# Patient Record
Sex: Female | Born: 1970
Health system: Southern US, Community
[De-identification: ages and names within clinical notes are randomized; demographics above are authoritative.]

## PROBLEM LIST (undated history)

## (undated) DIAGNOSIS — U071 COVID-19: Secondary | ICD-10-CM

## (undated) DIAGNOSIS — E785 Hyperlipidemia, unspecified: Secondary | ICD-10-CM

## (undated) DIAGNOSIS — E119 Type 2 diabetes mellitus without complications: Secondary | ICD-10-CM

## (undated) DIAGNOSIS — I2510 Atherosclerotic heart disease of native coronary artery without angina pectoris: Secondary | ICD-10-CM

## (undated) DIAGNOSIS — F419 Anxiety disorder, unspecified: Secondary | ICD-10-CM

## (undated) DIAGNOSIS — I10 Essential (primary) hypertension: Secondary | ICD-10-CM

## (undated) DIAGNOSIS — F988 Other specified behavioral and emotional disorders with onset usually occurring in childhood and adolescence: Secondary | ICD-10-CM

## (undated) HISTORY — DX: Type 2 diabetes mellitus without complications: E11.9

## (undated) HISTORY — DX: Atherosclerotic heart disease of native coronary artery without angina pectoris: I25.10

## (undated) HISTORY — DX: Essential (primary) hypertension: I10

## (undated) HISTORY — DX: Anxiety disorder, unspecified: F41.9

## (undated) HISTORY — DX: Other specified behavioral and emotional disorders with onset usually occurring in childhood and adolescence: F98.8

## (undated) HISTORY — DX: COVID-19: U07.1

## (undated) HISTORY — DX: Hyperlipidemia, unspecified: E78.5

---

## 2004-08-20 ENCOUNTER — Emergency Department: Payer: Self-pay | Admitting: Emergency Medicine

## 2004-12-20 ENCOUNTER — Emergency Department: Payer: Self-pay | Admitting: Emergency Medicine

## 2004-12-21 ENCOUNTER — Ambulatory Visit: Payer: Self-pay | Admitting: Emergency Medicine

## 2007-07-03 ENCOUNTER — Emergency Department: Payer: Self-pay | Admitting: Emergency Medicine

## 2008-01-24 ENCOUNTER — Emergency Department: Payer: Self-pay | Admitting: Emergency Medicine

## 2008-02-14 ENCOUNTER — Ambulatory Visit: Payer: Self-pay

## 2008-07-04 ENCOUNTER — Other Ambulatory Visit: Payer: Self-pay

## 2008-07-04 ENCOUNTER — Emergency Department: Payer: Self-pay | Admitting: Emergency Medicine

## 2008-12-31 DIAGNOSIS — F900 Attention-deficit hyperactivity disorder, predominantly inattentive type: Secondary | ICD-10-CM | POA: Insufficient documentation

## 2008-12-31 DIAGNOSIS — F419 Anxiety disorder, unspecified: Secondary | ICD-10-CM | POA: Insufficient documentation

## 2010-01-26 ENCOUNTER — Emergency Department: Payer: Self-pay | Admitting: Emergency Medicine

## 2012-03-25 ENCOUNTER — Emergency Department: Payer: Self-pay | Admitting: Emergency Medicine

## 2012-03-25 LAB — CBC WITH DIFFERENTIAL/PLATELET
Basophil %: 0.7 %
Eosinophil %: 0.4 %
HGB: 13.4 g/dL (ref 12.0–16.0)
Lymphocyte #: 1.5 10*3/uL (ref 1.0–3.6)
MCH: 33.3 pg (ref 26.0–34.0)
Monocyte #: 0.4 x10 3/mm (ref 0.2–0.9)
Monocyte %: 5 %
Neutrophil #: 6.6 10*3/uL — ABNORMAL HIGH (ref 1.4–6.5)
Platelet: 279 10*3/uL (ref 150–440)
RDW: 13 % (ref 11.5–14.5)

## 2012-03-25 LAB — COMPREHENSIVE METABOLIC PANEL
Alkaline Phosphatase: 100 U/L (ref 50–136)
Anion Gap: 12 (ref 7–16)
BUN: 18 mg/dL (ref 7–18)
Bilirubin,Total: 0.3 mg/dL (ref 0.2–1.0)
Co2: 20 mmol/L — ABNORMAL LOW (ref 21–32)
Osmolality: 288 (ref 275–301)
SGPT (ALT): 33 U/L
Total Protein: 7.2 g/dL (ref 6.4–8.2)

## 2012-03-25 LAB — URINALYSIS, COMPLETE
Bilirubin,UR: NEGATIVE
Glucose,UR: 150 mg/dL (ref 0–75)
Leukocyte Esterase: NEGATIVE
Ph: 6 (ref 4.5–8.0)
Specific Gravity: 1.021 (ref 1.003–1.030)
Squamous Epithelial: 1
WBC UR: 1 /HPF (ref 0–5)

## 2012-03-25 LAB — PREGNANCY, URINE: Pregnancy Test, Urine: NEGATIVE m[IU]/mL

## 2012-03-25 LAB — LIPASE, BLOOD: Lipase: 114 U/L (ref 73–393)

## 2012-11-19 ENCOUNTER — Inpatient Hospital Stay: Payer: Self-pay | Admitting: Internal Medicine

## 2012-11-19 LAB — BASIC METABOLIC PANEL
Anion Gap: 12 (ref 7–16)
BUN: 18 mg/dL (ref 7–18)
Chloride: 101 mmol/L (ref 98–107)
Co2: 23 mmol/L (ref 21–32)
Creatinine: 0.65 mg/dL (ref 0.60–1.30)
EGFR (African American): >60
EGFR (Non-African Amer.): >60
Potassium: 3.6 mmol/L (ref 3.5–5.1)
Sodium: 136 mmol/L (ref 136–145)

## 2012-11-19 LAB — CBC
HCT: 38.9 % (ref 35.0–47.0)
HGB: 13.6 g/dL (ref 12.0–16.0)
MCH: 33.2 pg (ref 26.0–34.0)
MCHC: 34.8 g/dL (ref 32.0–36.0)
MCV: 95 fL (ref 80–100)
Platelet: 302 10*3/uL (ref 150–440)
RBC: 4.09 10*6/uL (ref 3.80–5.20)
WBC: 8.5 10*3/uL (ref 3.6–11.0)

## 2012-11-19 LAB — CK TOTAL AND CKMB (NOT AT ARMC)
CK, Total: 60 U/L (ref 21–215)
CK, Total: 76 U/L (ref 21–215)
CK, Total: 95 U/L (ref 21–215)
CK-MB: 2.2 ng/mL (ref 0.5–3.6)
CK-MB: 3.1 ng/mL (ref 0.5–3.6)

## 2012-11-19 LAB — DRUG SCREEN, URINE
Benzodiazepine, Ur Scrn: NEGATIVE (ref ?–200)
Cannabinoid 50 Ng, Ur ~~LOC~~: NEGATIVE (ref ?–50)
Cocaine Metabolite,Ur ~~LOC~~: NEGATIVE (ref ?–300)
Tricyclic, Ur Screen: NEGATIVE (ref ?–1000)

## 2012-11-19 LAB — TROPONIN I: Troponin-I: <0.02 ng/mL

## 2012-11-19 LAB — HCG, QUANTITATIVE, PREGNANCY: Beta Hcg, Quant.: <1 m[IU]/mL — ABNORMAL LOW

## 2012-11-20 DIAGNOSIS — I2510 Atherosclerotic heart disease of native coronary artery without angina pectoris: Secondary | ICD-10-CM | POA: Insufficient documentation

## 2012-11-20 LAB — BASIC METABOLIC PANEL
Anion Gap: 19 — ABNORMAL HIGH (ref 7–16)
BUN: 14 mg/dL (ref 7–18)
BUN: 13 mg/dL (ref 7–18)
Calcium, Total: 8.5 mg/dL (ref 8.5–10.1)
Chloride: 100 mmol/L (ref 98–107)
Co2: 14 mmol/L — ABNORMAL LOW (ref 21–32)
Co2: 21 mmol/L (ref 21–32)
Creatinine: 0.35 mg/dL — ABNORMAL LOW (ref 0.60–1.30)
EGFR (Non-African Amer.): >60
Glucose: 311 mg/dL — ABNORMAL HIGH (ref 65–99)
Glucose: 302 mg/dL — ABNORMAL HIGH (ref 65–99)
Osmolality: 271 (ref 275–301)
Osmolality: 276 (ref 275–301)
Potassium: 3.8 mmol/L (ref 3.5–5.1)
Potassium: 5.5 mmol/L — ABNORMAL HIGH (ref 3.5–5.1)
Sodium: 129 mmol/L — ABNORMAL LOW (ref 136–145)
Sodium: 132 mmol/L — ABNORMAL LOW (ref 136–145)

## 2012-11-20 LAB — HEMOGLOBIN A1C: Hemoglobin A1C: 7.6 % — ABNORMAL HIGH (ref 4.2–6.3)

## 2012-11-20 LAB — LIPID PANEL
Cholesterol: 377 mg/dL — ABNORMAL HIGH (ref 0–200)
HDL Cholesterol: 23 mg/dL — ABNORMAL LOW (ref 40–60)

## 2012-11-20 LAB — CBC WITH DIFFERENTIAL/PLATELET
Eosinophil: 1 %
HCT: 35.8 % (ref 35.0–47.0)
HGB: 13.1 g/dL (ref 12.0–16.0)
MCH: 33.2 pg (ref 26.0–34.0)
MCHC: 35 g/dL (ref 32.0–36.0)
MCV: 93 fL (ref 80–100)

## 2012-11-21 LAB — BASIC METABOLIC PANEL
BUN: 15 mg/dL (ref 7–18)
Calcium, Total: 8.6 mg/dL (ref 8.5–10.1)
Creatinine: 0.48 mg/dL — ABNORMAL LOW (ref 0.60–1.30)
EGFR (African American): >60
EGFR (Non-African Amer.): >60
Glucose: 236 mg/dL — ABNORMAL HIGH (ref 65–99)
Potassium: 4.6 mmol/L (ref 3.5–5.1)
Sodium: 135 mmol/L — ABNORMAL LOW (ref 136–145)

## 2012-11-21 LAB — CBC
HCT: 38.5 % (ref 35.0–47.0)
HGB: 15.1 g/dL (ref 12.0–16.0)
MCH: 36.7 pg — ABNORMAL HIGH (ref 26.0–34.0)
MCHC: 35.4 g/dL (ref 32.0–36.0)
MCV: 94 fL (ref 80–100)
RBC: 4.12 10*6/uL (ref 3.80–5.20)

## 2012-11-21 LAB — PROTIME-INR: INR: 0.9

## 2014-06-19 LAB — HM PAP SMEAR: HM PAP: NEGATIVE

## 2014-07-02 ENCOUNTER — Ambulatory Visit: Payer: Self-pay | Admitting: Orthopedic Surgery

## 2015-01-22 NOTE — Discharge Summary (Signed)
PATIENT NAME:  Gabrielle Owens, [place] M MR#:  [id number] DATE OF BIRTH:  Feb 25, 1971  DATE OF ADMISSION:  11/19/2012 DATE OF DISCHARGE:  11/21/2012  ADMITTING DIAGNOSIS:  Chest pain.   DISCHARGE DIAGNOSES: 1.  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  2.  Severe hypertriglyceridemia with known history of hypertriglyceridemia.  The patient was noncompliant with her gemfibrozil.  3.  Diabetes.  4.  Morbid obesity.  5.  Hypertension.   PERTINENT LABORATORY AND EVALUATIONS:  Admitting glucose 237, BUN 18, creatinine 0.65, sodium 136, potassium 3.6, chloride 101, CO2 was 23, calcium was 8.5.  Troponin less than 0.02 initially, then after stress test it was up to 0.24 and then subsequently went up to 0.38.   WBC 8.5, hemoglobin 13.6, platelet count was 302.  Cholesterol 377, triglycerides 979.  Her hemoglobin A1c was 7.6.   CONSULTANTS:  Dr. Darrold JunkerParaschos and Dr. Lady GaryFath.   HOSPITAL COURSE:  Please refer to history and physical done by the admitting physician.  The patient is a 44 year old white female who has a history of hypertension, diabetes, hyperlipidemia, presented to the ED with complaint of chest pressure.  The patient initially in the ED revealed some T wave inversions in lead V3, but no other abnormality was noted.  Her troponin was normal initially.  So she was admitted and a cardiology consult and a stress test was ordered.  The patient underwent a stress test the next day which according to Dr. Lady GaryFath showed no ischemia or infarct.  But she again had some chest pain, therefore she underwent a cardiac cath.  Cardiac cath showed 100% occlusion of the RCA which was a small caliber vessel with good collaterals, so Dr. Darrold JunkerParaschos recommended medical management for that.  The patient at this point is chest  pain-free and is doing well and is stable for discharge.   DISCHARGE MEDICATIONS:  Hydrochlorothiazide 25 by mouth daily, gemfibrozil 600 1 tab by  mouth twice daily, fish oil 1000 mg two caps 2 times per day, metoprolol tartrate 100 1 tab by mouth twice daily, aspirin 325 mg by mouth daily, Cozaar 50 daily, glipizide 5 mg 1 tab by  mouth twice daily.  The patient is to resume metformin 500 1 tab by mouth twice daily after two days.   DIET:  Low sodium, low fat, low cholesterol, carbohydrate-controlled diet.   ACTIVITY:  As tolerated.    TIMEFRAME FOR FOLLOW-UP:  With primary physician in 1 to 2 weeks.  The patient is to keep a log of her blood glucose to take to her primary care provider.  The patient also encouraged to follow a strict diet and exercise.  Also discharge follow-up with Dr. Darrold JunkerParaschos in 7 to 10 days.   TIME SPENT:  35 minutes spent.     ____________________________ Lacie ScottsShreyang H. Allena KatzPatel, MD shp:ea D: 11/21/2012 17:23:39 ET T: 11/22/2012 05:33:35 ET JOB#: [id number]  cc: Tesneem Dufrane H. Allena KatzPatel, MD, <Dictator> Charise CarwinSHREYANG H Bryanne Riquelme MD ELECTRONICALLY SIGNED 11/23/2012 12:13

## 2015-01-22 NOTE — Consult Note (Signed)
   General Aspect 44 yo female with history of diabetes melitus, previous tobacco abuse( Quit two years ago) who was admitted after developing a midsternal tightness with radiation to her left arm with numbness in her left arm. EKG appears to have leads V1 and V3 reversed but was unchanged from baseline. She had a mild trponin elevatino to 0.25. Underwent ett mibi today with good exercize tolerance and no ekg changes or ischmeia on sestamibi images. She is curerntly pain free. She has not had symptoms simlar to this in the past.   Physical Exam:  GEN no acute distress   HEENT PERRL, hearing intact to voice   NECK supple  No masses   RESP normal resp effort  no use of accessory muscles   CARD Regular rate and rhythm  Normal, S1, S2  No murmur   ABD denies tenderness  denies Flank Tenderness   LYMPH negative neck, negative axillae   EXTR negative cyanosis/clubbing, negative edema   SKIN normal to palpation   NEURO cranial nerves intact, motor/sensory function intact   PSYCH A+O to time, place, person   Review of Systems:  Subjective/Chief Complaint chest tightness with left arm numbness   General: No Complaints   Skin: No Complaints   ENT: No Complaints   Neck: No Complaints   Respiratory: No Complaints   Cardiovascular: Tightness   Gastrointestinal: No Complaints   Genitourinary: No Complaints   Vascular: No Complaints   Musculoskeletal: No Complaints   Neurologic: No Complaints   Hematologic: No Complaints   EKG:  EKG NSR    No Known Allergies:    Impression 44 yo female with history of hypertension and diabetes who was admitted after an episode of chest tightness with left arm numbness. She had a mild troopniin elevation to 0.25 with no ischemic ekg changes and negative funcitonal study. Elevation in troponin appears to be demand in etiology.   Plan 1. Ambulate and discharge if stable on asa, metoprolol and lisinopril as well as other home meds. 2,  Follow up with Dr. Darrold JunkerParaschos as outpatient.   Electronic Signatures: Dalia HeadingFath, Kenneth A (MD)  (Signed 18-Feb-14 14:14)  Authored: General Aspect/Present Illness, History and Physical Exam, Review of System, EKG , Allergies, Impression/Plan   Last Updated: 18-Feb-14 14:14 by Dalia HeadingFath, Kenneth A (MD)

## 2015-01-22 NOTE — H&P (Signed)
PATIENT NAME:  Gabrielle Owens, Gabrielle Owens MR#:  [id number] DATE OF BIRTH:  1971-02-20  DATE OF ADMISSION:  11/19/2012  PRIMARY CARE PHYSICIAN: Demetrios Isaacs. Sherrie Mustache, MD  REFERRING PHYSICIAN:  Chiquita Loth, MD  CARDIOLOGIST: Marcina Millard, MD    CHIEF COMPLAINT: Chest pain.   HISTORY OF PRESENT ILLNESS: The patient is a 44 year old Caucasian female with a past medical history of hypertension, diabetes mellitus, hyperlipidemia, is presenting to the ER with a chief complaint of chest pressure. The patient is reporting that she suddenly started having chest pressure in the midsternal area while she was resting at around 11:00 p.m. yesterday. The chest pressure was slowly radiating to the left side of the chest, and to the left shoulder and down the arm, and eventually the left shoulder has become numb. This chest pressure was associated with nausea, diaphoresis and shortness of breath. The patient checked her blood pressure, which was really elevated. It was at 204/138. The patient immediately took aspirin and an extra dose of her home medication, Toprol, and called EMS. By the time patient came into the ER via EMS, her chest pressure was completely resolved, and the patient was chest pain-free at the time of arrival. Initial blood pressure was at 150/101 with a pulse of 79. Her initial troponin was negative at less than 0.02.  A 12-lead EKG has revealed new T wave inversion in lead V3. Lovenox 1 mg/kg subcutaneous x 1 was given in the ER, and hospitalist team is called to admit the patient regarding the unstable angina. The patient was seen by Dr. Darrold Junker, her cardiologist, last year, and she had echocardiogram done at that time. She has never had any stress test or cardiac cath done. The patient is reporting that she takes 3 medications regarding her blood pressure, and recently she lost her insurance and she ran out of losartan 2 months ago, and since then she has been taking only 2 medications regarding her blood  pressure. She denies any headache or blurry vision, no black spots either. She denies any other complaints. In the ER, Lovenox 1 mg/kg subcutaneous x 1 was given.    PAST MEDICAL HISTORY: Hypertension, diabetes mellitus, hyperlipidemia, obesity.   PAST SURGICAL HISTORY: She denies any.   ALLERGIES:  No known drug allergies.   HOME MEDICATIONS: Hydrochlorothiazide25 mg once daily, Xanax 0.5 mg p.o. as needed, Percocet 5/325 4 times a day, metoprolol tartrate 100 mg once a day, metformin 500 mg once daily, lisinopril dose unknown,gemfibrozil 600 mg 2 times a day, fish oil 1000 mg 2 capsules 2 times a day.   PSYCHOSOCIAL HISTORY: She lives at home with her 3 children. She used to smoke but quit smoking 2 years ago.  Occasional intake of alcohol, maybe 1 or 2 times in a month. Denies any illicit drug usage.   FAMILY HISTORY: Father had history of diabetes mellitus, hypertension, and had massive heart attack at age 34. Mom had a history of hypertension.   REVIEW OF SYSTEMS:  CONSTITUTIONAL: Denies any fever, fatigue, weakness. Denies any weight loss or weight gain.  EYES: No blurry vision, glaucoma, cataracts.  EARS, NOSE, THROAT: No tinnitus, ear pain, discharge.  RESPIRATORY: No cough, COPD, wheezing.  CARDIOVASCULAR: Midsternal chest pressure at around 11:00 p.m. on February 17th which was resolved after taking aspirin and beta blocker. Denies any palpitations.  GENITOURINARY: No dysuria, hematuria.  GYNECOLOGICAL AND BREASTS: No breast mass or vaginal discharge.  ENDOCRINE: No polyuria, nocturia, thyroid problems. HEMATOLOGIC/LYMPHATIC: Denies anemia, easy bruising.  MUSCULOSKELETAL:  Denies any neck pain, back pain, shoulder pain or hip pain.  NEUROLOGICAL: No CVA or TIA in the past. No vertigo or ataxia.  PSYCHIATRIC: Denies any insomnia, ADD, OCD.   PHYSICAL EXAMINATION: VITAL SIGNS: Temperature 98.4, pulse 93, respirations 18, blood pressure 150/83, pulse of 98%.  GENERAL APPEARANCE:  Not in acute distress, moderately  built and obese.  HEENT: Normocephalic, atraumatic. Pupils are equally reacting to light and accommodation. No scleral icterus. No sinus tenderness. No postnasal drip.  NECK: Supple. No JVD. No thyromegaly.  LUNGS: Clear to auscultation bilaterally. No accessory muscle usage, no anterior chest wall tenderness on palpation.   CARDIAC: S1, S2 normal. Regular rate and rhythm. No murmurs.  GASTROINTESTINAL: Soft, obese. Bowel sounds are positive in all 4 quadrants. Nontender, nondistended. No masses felt.  NEUROLOGICAL: Awake, alert, oriented x3. Cranial nerves II through XII are grossly intact. Motor and sensory are grossly intact. Reflexes are 2+.  EXTREMITIES: No edema. No cyanosis. No clubbing.  SKIN: No lesions. No rashes. Normal turgor, warm to touch.  PSYCHIATRIC: Normal mood and affect.   LABORATORY AND RADIOLOGICAL DATA:  Glucose 237, BUN 18, creatinine 0.6, sodium 136, potassium 3.6, chloride 101, CO2 23, anion gap is 12, serum osmolality 282, calcium 8.5. GFR greater than 60. CK total 60. Troponin less than 002. CPK-MB less than 0.5. Urine drug screen is negative. WBC 8.5, hemoglobin 13.6, hematocrit 38.9, platelet count 302.   A 12-lead EKG has revealed normal sinus rhythm at 81 beats per minute, normal PR interval, normal QRS. A new T wave inversion is noticed in only lead V3. No ST elevations or depressions are noticed.   ASSESSMENT AND PLAN: The patient is a 44 year old Caucasian female presenting to the ER with chief complaint of midsternal chest pressure radiating to the left shoulder, associated with nausea, shortness of breath and diaphoresis which was  subsequently resolved after taking aspirin and beta blocker.  She will be admitted with the following assessment and plan.   1. Unstable angina: We will keep her n.p.o. We will provide her IV fluids with half normal saline with 20 KCl. Lovenox 1 mg/kg subcutaneous q. 12 hours will be given. We will  schedule a Lexiscan stress test in the a.m. ACS protocol. Consult cardiology, Dr. Darrold JunkerParaschos, as the patient was seen by Dr. Darrold JunkerParaschos in the past. I will hold the blood pressure medications in the morning as the patient is scheduled for stress test in the a.m.  2. Hypertensive urgency: We will resume her home medications and up-titrate on as-needed basis. Consult case management regarding discharge planning as she does not have insurance.  3. Diabetes mellitus: The patient being n.p.o., we will provide her insulin sliding scale and hold metformin for now.  4. Hyperlipidemia: Plan is to check her fasting lipid panel and will provide her statin.  5. Obesity: The patient should start working on her diet and exercise. We will put in a consult to a dietitian.  6. Gastrointestinal and deep vein thrombosis prophylaxis will be provided.   CODE STATUS: She is FULL CODE.   The diagnosis and plan of care was discussed with the patient.  She is aware of the plan.  TOTAL TIME SPENT ON ADMISSION: 50 minutes.   ____________________________ Gabrielle LabAruna Jeral Zick, MD ag:cb D: 11/19/2012 04:35:39 ET T: 11/19/2012 09:55:13 ET JOB#: [id number]  cc: Gabrielle LabAruna Charity Tessier, MD, <Dictator> Marcina MillardAlexander Paraschos, MD Demetrios Isaacsonald E. Sherrie MustacheFisher, MD  Gabrielle LabARUNA Saadiq Poche MD ELECTRONICALLY SIGNED 11/20/2012 5:37

## 2015-03-10 ENCOUNTER — Ambulatory Visit: Payer: Medicaid Other | Admitting: Family Medicine

## 2015-03-10 DIAGNOSIS — M5440 Lumbago with sciatica, unspecified side: Secondary | ICD-10-CM | POA: Insufficient documentation

## 2015-03-10 DIAGNOSIS — R87810 Cervical high risk human papillomavirus (HPV) DNA test positive: Secondary | ICD-10-CM | POA: Insufficient documentation

## 2015-03-10 DIAGNOSIS — M25569 Pain in unspecified knee: Secondary | ICD-10-CM | POA: Insufficient documentation

## 2015-03-10 DIAGNOSIS — R6889 Other general symptoms and signs: Secondary | ICD-10-CM | POA: Insufficient documentation

## 2015-03-10 DIAGNOSIS — R319 Hematuria, unspecified: Secondary | ICD-10-CM | POA: Insufficient documentation

## 2015-03-10 HISTORY — DX: Cervical high risk human papillomavirus (HPV) DNA test positive: R87.810

## 2015-03-15 ENCOUNTER — Other Ambulatory Visit: Payer: Self-pay | Admitting: Family Medicine

## 2015-03-15 DIAGNOSIS — F419 Anxiety disorder, unspecified: Secondary | ICD-10-CM

## 2015-03-15 NOTE — Telephone Encounter (Signed)
Please call in  [id number] [phone]   Surescripts Out Interface  6 hours ago   (1:47 PM)       Requested Medications     Medication name:  Name from pharmacy:  ALPRAZolam (XANAX) 0.5 MG tablet ALPRAZOLAM 0.5 MG TABLET    Sig: TAKE 1 TABLET BY MOUTH EVERY 8 HOURS AS NEEDED    #30, RF x3

## 2015-03-16 NOTE — Telephone Encounter (Signed)
Rx phoned into pharmacy.

## 2015-04-28 ENCOUNTER — Encounter: Payer: Self-pay | Admitting: Family Medicine

## 2015-04-28 ENCOUNTER — Ambulatory Visit (INDEPENDENT_AMBULATORY_CARE_PROVIDER_SITE_OTHER): Payer: Medicaid Other | Admitting: Family Medicine

## 2015-04-28 VITALS — BP 130/74 | HR 76 | Temp 98.4°F | Resp 16 | Wt 198.6 lb

## 2015-04-28 DIAGNOSIS — S39012A Strain of muscle, fascia and tendon of lower back, initial encounter: Secondary | ICD-10-CM | POA: Diagnosis not present

## 2015-04-28 DIAGNOSIS — E781 Pure hyperglyceridemia: Secondary | ICD-10-CM | POA: Insufficient documentation

## 2015-04-28 DIAGNOSIS — E119 Type 2 diabetes mellitus without complications: Secondary | ICD-10-CM | POA: Insufficient documentation

## 2015-04-28 DIAGNOSIS — IMO0002 Reserved for concepts with insufficient information to code with codable children: Secondary | ICD-10-CM | POA: Insufficient documentation

## 2015-04-28 DIAGNOSIS — G8929 Other chronic pain: Secondary | ICD-10-CM | POA: Insufficient documentation

## 2015-04-28 MED ORDER — HYDROCODONE-ACETAMINOPHEN 5-325 MG PO TABS
ORAL_TABLET | ORAL | Status: DC
Start: 2015-04-28 — End: 2015-05-11

## 2015-04-28 NOTE — Progress Notes (Signed)
Subjective:     Patient ID: Gabrielle Owens, female   DOB: [birth year], 44 y.o.   MRN: [id number]  HPI  Chief Complaint  Patient presents with  . Back Pain    X 4 days  Denies radiation of pain or spasm. Hx of L4-L5 central disk herniation without canal impingement per MRI 2009. States she was helping her fiance move and helped lift an 80# amplifier. Has tried ibuprofen 800 mg.and meloxicam for her sx. 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   Review of Systems  Constitutional: Negative for fever and chills.  Genitourinary: Negative for dysuria.  Musculoskeletal:       Pain increases with sitting and improves with lying down.       Objective:   Physical Exam  Constitutional: She appears well-developed and well-nourished. She appears distressed (mild pain ).  Musculoskeletal:  Localizes to lower lumbar vertebral area. Minimally tender to the touch. Muscle strength in lower extremities 5/5.  Right SLR to 90 degrees without radiation of back pain. Left SLR to 45 degrees before increased back pain.       Assessment:    1. Low back strain, initial encounter - HYDROcodone-acetaminophen (NORCO/VICODIN) 5-325 MG per tablet; One every 4-6 hours as needed for pain  Dispense: 28 tablet; Refill: 0    Plan:    Continue ibuprofen 800 mg. 3 x day with food. Will take today off from work.

## 2015-04-28 NOTE — Patient Instructions (Addendum)
Continue ibuprofen 800 mg. 3 x day with food. Call if you are not improving over two weeks or pain is traveling down your leg.

## 2015-05-03 ENCOUNTER — Encounter: Payer: Self-pay | Admitting: Family Medicine

## 2015-05-11 ENCOUNTER — Encounter: Payer: Self-pay | Admitting: Family Medicine

## 2015-05-11 ENCOUNTER — Ambulatory Visit (INDEPENDENT_AMBULATORY_CARE_PROVIDER_SITE_OTHER): Payer: Medicaid Other | Admitting: Family Medicine

## 2015-05-11 VITALS — BP 124/82 | HR 62 | Temp 98.2°F | Resp 16 | Ht 62.0 in | Wt 200.0 lb

## 2015-05-11 DIAGNOSIS — S39012A Strain of muscle, fascia and tendon of lower back, initial encounter: Secondary | ICD-10-CM | POA: Insufficient documentation

## 2015-05-11 DIAGNOSIS — S39012D Strain of muscle, fascia and tendon of lower back, subsequent encounter: Secondary | ICD-10-CM | POA: Diagnosis not present

## 2015-05-11 MED ORDER — TRAMADOL HCL 50 MG PO TABS: 50.0000 mg | ORAL_TABLET | Freq: Four times a day (QID) | ORAL | Status: DC | PRN

## 2015-05-11 NOTE — Patient Instructions (Signed)
Discussed use of ibuprofen 400 mg. (two over the counter pills) 3 x day with food.

## 2015-05-11 NOTE — Progress Notes (Signed)
Subjective:     Patient ID: Gabrielle Owens, female   DOB: 1970/11/11, 44 y.o.   MRN: [id number]  HPI  Chief Complaint  Patient presents with  . Follow-up    Patient is present in office today for follow up from 7/27. Patient was seen in office with complaints of back pain and was prescribed Hydrocodone-Acetaminophen and Ibuprofen. Patient states she d/c Hydrocodone because it made her feel "loopy" and had taken Ultram for pain she states today that pain is still same.   States she has not been taking ibuprofen. Reports improvement but has taken 2-3 Tramadol daily to get through her work day. No radiation of pain or re-injury reported.   Review of Systems     Objective:   Physical Exam  Constitutional: She appears well-developed and well-nourished. No distress.  Musculoskeletal:  Localizes to lumbar vertebral area. SLR's improved to 75 degrees bilaterally.       Assessment:    1. Low back strain, subsequent encounter: improving - traMADol (ULTRAM) 50 MG tablet; Take 1 tablet (50 mg total) by mouth every 6 (six) hours as needed.  Dispense: 40 tablet; Refill: 0    Plan:    Add ibuprofen 400 mg. 3 x day with food. Call if no change in two weeks for consideration of LS spine x-ray.

## 2015-05-16 ENCOUNTER — Other Ambulatory Visit: Payer: Self-pay | Admitting: Family Medicine

## 2015-05-16 DIAGNOSIS — F419 Anxiety disorder, unspecified: Secondary | ICD-10-CM

## 2015-05-17 NOTE — Telephone Encounter (Signed)
Last ov for anxiety 06/19/14

## 2015-05-19 NOTE — Telephone Encounter (Signed)
Please call in the following medication.  [id number] [phone]   Surescripts Out Interface  3 days ago   (9:53 PM)       Requested Medications     Name from pharmacy:  In chart as:  ALPRAZOLAM 0.5 MG TABLET ALPRAZolam (XANAX) 0.5 MG tablet    Sig: TAKE 1 TABLET BY MOUTH EVERY 8 HOURS AS NEEDED    Dispense: 30 tablet   Refills: 1

## 2015-05-25 ENCOUNTER — Other Ambulatory Visit: Payer: Self-pay | Admitting: Family Medicine

## 2015-05-25 MED ORDER — METFORMIN HCL ER 500 MG PO TB24: ORAL_TABLET | ORAL | Status: DC

## 2015-05-25 NOTE — Telephone Encounter (Signed)
Pt scheduled an appt for next Wednesday for a f/u but she stated that she needs metFORMIN (GLUCOPHAGE) 1000 MG tablet to [facility]. Pt stated she will be out after tomorrow and next Wednesday is as soon as she can come in. Thanks TNP

## 2015-05-25 NOTE — Telephone Encounter (Signed)
Please advise refill? 

## 2015-05-31 ENCOUNTER — Other Ambulatory Visit: Payer: Self-pay | Admitting: Family Medicine

## 2015-05-31 DIAGNOSIS — S39012D Strain of muscle, fascia and tendon of lower back, subsequent encounter: Secondary | ICD-10-CM

## 2015-05-31 MED ORDER — TRAMADOL HCL 50 MG PO TABS: 50.0000 mg | ORAL_TABLET | Freq: Four times a day (QID) | ORAL | Status: DC | PRN

## 2015-05-31 NOTE — Telephone Encounter (Signed)
Refill request

## 2015-05-31 NOTE — Telephone Encounter (Signed)
Tramadol rx up front for pickup 

## 2015-05-31 NOTE — Telephone Encounter (Signed)
traMADol (ULTRAM) 50 MG tablet 05/11/15 -- Anola Gurney, PA Take 1 tablet (50 mg total) by mouth every 6 (six) hours as needed  [facility] [address]  Thanks [employer]

## 2015-05-31 NOTE — Telephone Encounter (Signed)
Need to see her in the office if she still is having back pain requiring Tramadol.

## 2015-05-31 NOTE — Telephone Encounter (Signed)
Patient states that she has an appt scheduled with you tomorrow to follow up in reference to back pain, she states that she is out of Rx and has to work tonight that is why she is requesting refill for today. Please advise. KW

## 2015-05-31 NOTE — Telephone Encounter (Signed)
Patient has been advised for pick up. KW

## 2015-06-01 ENCOUNTER — Ambulatory Visit: Payer: Self-pay | Admitting: Family Medicine

## 2015-06-02 ENCOUNTER — Ambulatory Visit: Payer: Self-pay | Admitting: Family Medicine

## 2015-06-22 ENCOUNTER — Ambulatory Visit (INDEPENDENT_AMBULATORY_CARE_PROVIDER_SITE_OTHER): Payer: Medicaid Other | Admitting: Family Medicine

## 2015-06-22 ENCOUNTER — Encounter: Payer: Self-pay | Admitting: Family Medicine

## 2015-06-22 DIAGNOSIS — S39012D Strain of muscle, fascia and tendon of lower back, subsequent encounter: Secondary | ICD-10-CM | POA: Diagnosis not present

## 2015-06-22 MED ORDER — TRAMADOL HCL 50 MG PO TABS: 50.0000 mg | ORAL_TABLET | Freq: Four times a day (QID) | ORAL | Status: DC | PRN

## 2015-06-22 NOTE — Patient Instructions (Signed)
We will call with the x-ray report.

## 2015-06-22 NOTE — Progress Notes (Signed)
Subjective:     Patient ID: Gabrielle Owens, female   DOB: 1970/12/03, 44 y.o.   MRN: [id number]  HPI  Chief Complaint  Patient presents with  . Follow-up    Patient is present in office today for follow up from 05/11/15. Patient was diagnosed with lower back strain which was improving on Tramadol , patient was advised to add Ibuprofen  TID. Patient reports that pain is intermittent.   States pain level varies from day to day and she does not always need pain medication. Reports a burning pain when she sits and more of a sharp spasm pain when she may be changing postions bending her over for a few seconds.   Review of Systems  Musculoskeletal:       No radiation of pain       Objective:   Physical Exam  Constitutional: She appears well-developed and well-nourished. No distress.  Musculoskeletal:  Localizes to midline lower lumbar vertebral/paravertebral area. Non-tender today.       Assessment:    1. Low back strain, subsequent encounter - traMADol (ULTRAM) 50 MG tablet; Take 1 tablet (50 mg total) by mouth every 6 (six) hours as needed.  Dispense: 40 tablet; Refill: 1 - DG Lumbar Spine Complete; Future    Plan:    Further f/u pending x-ray report.

## 2015-06-28 ENCOUNTER — Ambulatory Visit
Admission: RE | Admit: 2015-06-28 | Discharge: 2015-06-28 | Disposition: A | Discharge status: Home / Self Care | Payer: Medicaid Other | Source: Ambulatory Visit | Attending: Family Medicine | Admitting: Family Medicine

## 2015-06-28 DIAGNOSIS — S39012D Strain of muscle, fascia and tendon of lower back, subsequent encounter: Secondary | ICD-10-CM

## 2015-06-28 DIAGNOSIS — K8020 Calculus of gallbladder without cholecystitis without obstruction: Secondary | ICD-10-CM | POA: Diagnosis not present

## 2015-06-28 DIAGNOSIS — M47816 Spondylosis without myelopathy or radiculopathy, lumbar region: Secondary | ICD-10-CM | POA: Diagnosis not present

## 2015-06-28 DIAGNOSIS — X58XXXD Exposure to other specified factors, subsequent encounter: Secondary | ICD-10-CM | POA: Diagnosis not present

## 2015-06-29 ENCOUNTER — Telehealth: Payer: Self-pay

## 2015-06-29 ENCOUNTER — Other Ambulatory Visit: Payer: Self-pay | Admitting: Family Medicine

## 2015-06-29 DIAGNOSIS — M545 Low back pain, unspecified: Secondary | ICD-10-CM

## 2015-06-29 NOTE — Telephone Encounter (Signed)
Pt returned call. Thanks TNP °

## 2015-06-29 NOTE — Telephone Encounter (Signed)
Advised patient of report she states she would like to proceed with orthopedic evaluation and request that we do not send her to [facility] she would like to go to a practice in [place]. KW

## 2015-06-29 NOTE — Telephone Encounter (Signed)
-----   Message from Anola Gurney, [place] sent at 06/28/2015  4:24 PM EDT ----- Arthritic changes at L4-L5 where she has had previous disc bulge on MRI.Marland Kitchen Also incidental finding of gallstone (this would not give her low back pain) Would recommend orthopedic evaluation. Does she wish to proceed?

## 2015-06-29 NOTE — Telephone Encounter (Signed)
[id number] 

## 2015-06-29 NOTE — Telephone Encounter (Signed)
Referral in progress. 

## 2015-06-29 NOTE — Telephone Encounter (Signed)
Pt is returning call.  ZO#[phone]/[phone]

## 2015-07-12 ENCOUNTER — Other Ambulatory Visit: Payer: Self-pay | Admitting: Family Medicine

## 2015-07-14 ENCOUNTER — Telehealth: Payer: Self-pay | Admitting: Family Medicine

## 2015-07-20 ENCOUNTER — Ambulatory Visit: Payer: Self-pay | Admitting: Family Medicine

## 2015-09-02 ENCOUNTER — Ambulatory Visit (INDEPENDENT_AMBULATORY_CARE_PROVIDER_SITE_OTHER): Payer: 59 | Admitting: Family Medicine

## 2015-09-02 ENCOUNTER — Encounter: Payer: Self-pay | Admitting: Family Medicine

## 2015-09-02 VITALS — BP 140/100 | HR 76 | Temp 97.7°F | Resp 16 | Ht 62.0 in | Wt 200.0 lb

## 2015-09-02 DIAGNOSIS — E119 Type 2 diabetes mellitus without complications: Secondary | ICD-10-CM

## 2015-09-02 DIAGNOSIS — F419 Anxiety disorder, unspecified: Secondary | ICD-10-CM

## 2015-09-02 DIAGNOSIS — S39012D Strain of muscle, fascia and tendon of lower back, subsequent encounter: Secondary | ICD-10-CM | POA: Diagnosis not present

## 2015-09-02 DIAGNOSIS — I10 Essential (primary) hypertension: Secondary | ICD-10-CM

## 2015-09-02 DIAGNOSIS — G8929 Other chronic pain: Secondary | ICD-10-CM

## 2015-09-02 DIAGNOSIS — E781 Pure hyperglyceridemia: Secondary | ICD-10-CM

## 2015-09-02 LAB — POCT GLYCOSYLATED HEMOGLOBIN (HGB A1C)
Est. average glucose Bld gHb Est-mCnc: 189
Hemoglobin A1C: 8.2

## 2015-09-02 MED ORDER — METFORMIN HCL ER 500 MG PO TB24: 1000.0000 mg | ORAL_TABLET | Freq: Two times a day (BID) | ORAL | Status: DC

## 2015-09-02 MED ORDER — TRAMADOL HCL 50 MG PO TABS: 50.0000 mg | ORAL_TABLET | Freq: Four times a day (QID) | ORAL | Status: DC | PRN

## 2015-09-02 MED ORDER — ALPRAZOLAM 0.5 MG PO TABS: 0.5000 mg | ORAL_TABLET | Freq: Three times a day (TID) | ORAL | Status: DC | PRN

## 2015-09-02 NOTE — Progress Notes (Signed)
Patient: Gabrielle Owens Female    DOB: 20-Nov-1970   44 y.o.   MRN: [id number] Visit Date: 09/02/2015  Today's Provider: Mila Merryonald Sargun Rummell, MD   Chief Complaint  Patient presents with  . Diabetes    follow up  . Anxiety    follow up   Subjective:    HPI  Diabetes Mellitus Type II, Follow-up:   Lab Results  Component Value Date   HGBA1C 7.6* 11/20/2012    Last seen for diabetes 1 years ago.  Her A1c done through work in August 2015 was 10.7% Management since then includes increasing Metformin to 1,000mg  twice daily.  She has been back on 500mg  twice a day since October since pharmacy requested 500mg  tablets instead of the 1000s. She did tolerate the 1000 just fine, but didn't notice much change in her blood sugars.  She reports good compliance with treatment. She is not having side effects.  Current symptoms include none and have been stable. Home blood sugar records: fasting range: 150-200  Episodes of hypoglycemia? no   Current Insulin Regimen: none Most Recent Eye Exam: 1 year ago Weight trend: stable Prior visit with dietician: no Current diet: in general, a "healthy" diet   Current exercise: walking at work uses her fit bit  Pertinent Labs:    Component Value Date/Time   CHOL 377* 11/20/2012 0800   TRIG 979* 11/20/2012 0800   HDL 23* 11/20/2012 0800   LDLCALC SEE COMMENT 11/20/2012 0800   CREATININE 0.48* 11/21/2012 0458    Wt Readings from Last 3 Encounters:  06/22/15 198 lb 9.6 oz (90.084 kg)  05/11/15 200 lb (90.719 kg)  04/28/15 198 lb 9.6 oz (90.084 kg)    ------------------------------------------------------------------------  Hypertension, follow-up:  BP Readings from Last 3 Encounters:  09/02/15 140/100  06/22/15 112/84  05/11/15 124/82    Hypertension is followed by Dr. Jeri LagerParashcos and states she has follow up in January. She states she occasionally misses medication due to work schedule, and did miss her last dose of  metoprolol  She is not having side effects.  She is exercising. She is adherent to low salt diet.   Outside blood pressures are not being checked. She is experiencing none.  Patient denies chest pain, chest pressure/discomfort, claudication, dyspnea, exertional chest pressure/discomfort, fatigue, irregular heart beat, lower extremity edema, near-syncope, orthopnea, palpitations and paroxysmal nocturnal dyspnea.       Weight trend: stable Wt Readings from Last 3 Encounters:  09/02/15 200 lb (90.719 kg)  06/22/15 198 lb 9.6 oz (90.084 kg)  05/11/15 200 lb (90.719 kg)    Current diet: in general, a "healthy" diet    ------------------------------------------------------------------------    Follow up Anxiety: Last office visit was 1 year ago and no changes were made. Current treatment includes Xanax as needed. Patient has been out of this medication for 1 month and request a refill. She only takes periodically, but finds that it works well.     No Known Allergies Previous Medications   ALPRAZOLAM (XANAX) 0.5 MG TABLET    TAKE 1 TABLET BY MOUTH EVERY 8 HOURS AS NEEDED   ASPIRIN 325 MG TABLET    Take 1 tablet by mouth daily.   ASPIRIN BUFFERED PO    Take 325 mg by mouth.   GEMFIBROZIL (LOPID) 600 MG TABLET    Take 1 tablet by mouth 2 (two) times daily.   HYDROCHLOROTHIAZIDE (HYDRODIURIL) 25 MG TABLET    HYDROCHLOROTHIAZIDE, 25MG  (Oral Tablet)  1 every  day for 0 days  Quantity: 0.00;  Refills: 0   Ordered :31-Jan-2012  Janey Greaser ;  Started 31-Dec-2008 Active   LOSARTAN (COZAAR) 50 MG TABLET    Take 1 tablet by mouth daily.   METFORMIN (GLUCOPHAGE-XR) 500 MG 24 HR TABLET    ONE TABLET EVERY MORNING BEFORE BREAKFAST, ONE BEFORE DINNER   METOPROLOL (LOPRESSOR) 100 MG TABLET    Take 1 tablet by mouth 2 (two) times daily.   OMEGA-3 FATTY ACIDS (FISH OIL BURP-LESS) 1200 MG CAPS    Take 2 capsules by mouth 2 (two) times daily.   TRAMADOL (ULTRAM) 50 MG TABLET    Take 1 tablet (50  mg total) by mouth every 6 (six) hours as needed.    Review of Systems  Constitutional: Negative for fever, chills, appetite change and fatigue.  Respiratory: Negative for chest tightness and shortness of breath.   Cardiovascular: Negative for chest pain and palpitations.  Gastrointestinal: Negative for nausea, vomiting and abdominal pain.  Neurological: Negative for dizziness and weakness.    Social History  Substance Use Topics  . Smoking status: Former Smoker -- 1.00 packs/day for 24 years    Types: Cigarettes    Quit date: 03/03/2011  . Smokeless tobacco: Never Used  . Alcohol Use: 0.0 oz/week    0 Standard drinks or equivalent per week     Comment: occasional   Objective:   BP 140/100 mmHg  Pulse 76  Temp(Src) 97.7 F (36.5 C) (Oral)  Resp 16  Ht  (1.575 m)  Wt 200 lb (90.719 kg)  BMI 36.57 kg/m2  SpO2 98%  LMP  (Within Weeks)  Physical Exam  General Appearance:    Alert, cooperative, no distress, obese  Eyes:    PERRL, conjunctiva/corneas clear, EOM's intact       Lungs:     Clear to auscultation bilaterally, respirations unlabored  Heart:    Regular rate and rhythm  Neurologic:   Awake, alert, oriented x 3. No apparent focal neurological           defect.         Results for orders placed or performed in visit on 09/02/15  POCT HgB A1C  Result Value Ref Range   Hemoglobin A1C 8.2    Est. average glucose Bld gHb Est-mCnc 189        Assessment & Plan:       1. Unontrolled type 2 diabetes mellitus without complication, without long-term current use of insulin (HCC) Go back up to  twice a day metformin - POCT HgB A1C - metFORMIN (GLUCOPHAGE-XR) 500 MG 24 hr tablet; Take 2 tablets (1,000 mg total) by mouth 2 (two) times daily.  Dispense: 120 tablet; Refill: 4 - Renal function panel  2. Low back strain, subsequent encounter Doing well with occasional tramadol which she tolerates well - traMADol (ULTRAM) 50 MG tablet; Take 1 tablet (50 mg  total) by mouth every 6 (six) hours as needed.  Dispense: 40 tablet; Refill: 1  3. Anxiety disorder, unspecified anxiety disorder type Does well with occasional alprazolam.  - ALPRAZolam (XANAX) 0.5 MG tablet; Take 1 tablet (0.5 mg total) by mouth every 8 (eight) hours as needed.  Dispense: 30 tablet; Refill: 2  4. Essential (primary) hypertension Counseled on importance of not missing doses of BP medications, especially metoprolol due to potential for rebound. Continue current medications.  Follow up Dr. Darrold Junker in January  5. Hypertriglyceridemia Doing well with gemfibrazole. - Lipid panel - Hepatic function  panel  She states she had her flu shot at work.   Return in about 3 months (around 12/01/2015).  Mila Merry, MD  [facility]

## 2015-10-07 ENCOUNTER — Encounter: Payer: Self-pay | Admitting: Physician Assistant

## 2015-10-07 ENCOUNTER — Ambulatory Visit (INDEPENDENT_AMBULATORY_CARE_PROVIDER_SITE_OTHER): Payer: 59 | Admitting: Physician Assistant

## 2015-10-07 VITALS — BP 138/98 | HR 71 | Temp 99.2°F | Resp 16 | Wt 197.8 lb

## 2015-10-07 DIAGNOSIS — E119 Type 2 diabetes mellitus without complications: Secondary | ICD-10-CM

## 2015-10-07 LAB — POCT GLYCOSYLATED HEMOGLOBIN (HGB A1C)
Est. average glucose Bld gHb Est-mCnc: 237
HEMOGLOBIN A1C: 9.9

## 2015-10-07 MED ORDER — METFORMIN HCL ER (OSM) 1000 MG PO TB24: 1000.0000 mg | ORAL_TABLET | Freq: Two times a day (BID) | ORAL | Status: DC

## 2015-10-07 NOTE — Progress Notes (Signed)
Patient: Gabrielle Owens Female    DOB: November 17, 1970   45 y.o.   MRN: [id number] Visit Date: 10/07/2015  Today's Provider: Margaretann Loveless, PA-C   Chief Complaint  Patient presents with  . Diabetes   Subjective:    HPI Diabetes Mellitus: Patient presents for concerns of blood sugar to high at home.The initial diagnosis of diabetes was made over a year ago. Patient was at work yesterday and was not feeling well. Patient got her BP checked and was 140/100 and sugar level was at 414 with breakfast and with out medication. Patient needs a note to go back to work on Monday. Patient states feels good no excessive thirst, dizziness, blurred Vision, leg swelling. Per patient is fasting today. She states that her metformin was recently decreased to 500 mg twice daily instead of 1000 mg twice daily. She also reports not adhering to a diabetic diet over the holiday break.   No Known Allergies Previous Medications   ALPRAZOLAM (XANAX) 0.5 MG TABLET    Take 1 tablet (0.5 mg total) by mouth every 8 (eight) hours as needed.   ASPIRIN 81 MG TABLET    Take 81 mg by mouth daily.   ASPIRIN BUFFERED PO    Take 325 mg by mouth.   GEMFIBROZIL (LOPID) 600 MG TABLET    Take 1 tablet by mouth 2 (two) times daily.   HYDROCHLOROTHIAZIDE (HYDRODIURIL) 25 MG TABLET    HYDROCHLOROTHIAZIDE, 25MG  (Oral Tablet)  1 every day for 0 days  Quantity: 0.00;  Refills: 0   Ordered :31-Jan-2012  Janey Greaser ;  Started 31-Dec-2008 Active   LOSARTAN (COZAAR) 50 MG TABLET    Take 1 tablet by mouth daily.   METFORMIN (GLUCOPHAGE-XR) 500 MG 24 HR TABLET    Take 2 tablets (1,000 mg total) by mouth 2 (two) times daily.   METOPROLOL (LOPRESSOR) 100 MG TABLET    Take 1 tablet by mouth 2 (two) times daily.   OMEGA-3 FATTY ACIDS (FISH OIL BURP-LESS) 1200 MG CAPS    Take 2 capsules by mouth 2 (two) times daily.   PROAIR HFA 108 (90 BASE) MCG/ACT INHALER       TRAMADOL (ULTRAM) 50 MG TABLET    Take 1 tablet (50 mg total)  by mouth every 6 (six) hours as needed.    Review of Systems  Constitutional: Negative.   HENT: Negative.   Eyes: Negative.   Respiratory: Negative.   Cardiovascular: Negative.   Gastrointestinal: Negative.   Endocrine: Negative.   Genitourinary: Negative.   Musculoskeletal: Negative.   Skin: Negative.   Allergic/Immunologic: Negative.   Neurological: Negative.   Hematological: Negative.   Psychiatric/Behavioral: Negative.     Social History  Substance Use Topics  . Smoking status: Former Smoker -- 1.00 packs/day for 24 years    Types: Cigarettes    Quit date: 03/03/2011  . Smokeless tobacco: Never Used  . Alcohol Use: 0.0 oz/week    0 Standard drinks or equivalent per week     Comment: occasional   Objective:   BP 138/98 mmHg  Pulse 71  Temp(Src) 99.2 F (37.3 C) (Oral)  Resp 16  Wt 197 lb 12.8 oz (89.721 kg)  LMP 09/25/2015  Physical Exam  Constitutional: She appears well-developed and well-nourished. No distress.  Neck: Normal range of motion. Neck supple.  Cardiovascular: Normal rate, regular rhythm and normal heart sounds.  Exam reveals no gallop and no friction rub.   No murmur heard. Pulmonary/Chest:  Effort normal and breath sounds normal. No respiratory distress. She has no wheezes. She has no rales.  Skin: She is not diaphoretic.  Vitals reviewed.       Assessment & Plan:     1. Diabetes mellitus with no complication (HCC) Hemoglobin A1c had increased to 9.9. Will increase metformin dose back to 1000 mg twice daily. She is to keep her follow-up with Dr. Sherrie MustacheFisher that she has in the coming months. She is to call the office if symptoms worsen or if she gets glucose readings at home that remained steadily elevated. - POCT glycosylated hemoglobin (Hb A1C)  2. Controlled type 2 diabetes mellitus without complication, without long-term current use of insulin (HCC) See above medical treatment plan. - metFORMIN (FORTAMET) 1000 MG (OSM) 24 hr tablet; Take 1  tablet (1,000 mg total) by mouth 2 (two) times daily.  Dispense: 60 tablet; Refill: 1       Margaretann LovelessJennifer M Doniqua Saxby, PA-C  [facility]

## 2015-11-04 ENCOUNTER — Other Ambulatory Visit: Payer: Self-pay | Admitting: Family Medicine

## 2015-11-05 NOTE — Telephone Encounter (Signed)
Rx called in to pharmacy. 

## 2015-11-05 NOTE — Telephone Encounter (Signed)
Please call in tramadol.  

## 2015-12-02 ENCOUNTER — Ambulatory Visit: Payer: Self-pay | Admitting: Family Medicine

## 2016-01-06 ENCOUNTER — Other Ambulatory Visit: Payer: Self-pay | Admitting: Family Medicine

## 2016-01-06 NOTE — Telephone Encounter (Signed)
Rx called in to pharmacy. 

## 2016-01-06 NOTE — Telephone Encounter (Signed)
Please call in tramadol.  

## 2016-03-14 ENCOUNTER — Other Ambulatory Visit: Payer: Self-pay | Admitting: Family Medicine

## 2016-03-15 NOTE — Telephone Encounter (Signed)
Rx called in to pharmacy. 

## 2016-03-15 NOTE — Telephone Encounter (Signed)
Please call in alprazolam.  

## 2016-05-09 NOTE — Telephone Encounter (Signed)
error 

## 2016-05-11 ENCOUNTER — Other Ambulatory Visit: Payer: Self-pay | Admitting: Family Medicine

## 2016-05-12 NOTE — Telephone Encounter (Signed)
Please call in tramadol.  

## 2016-06-12 ENCOUNTER — Other Ambulatory Visit: Payer: Self-pay | Admitting: Family Medicine

## 2016-06-12 DIAGNOSIS — E119 Type 2 diabetes mellitus without complications: Secondary | ICD-10-CM

## 2016-07-01 ENCOUNTER — Other Ambulatory Visit: Payer: Self-pay | Admitting: Family Medicine

## 2016-07-01 DIAGNOSIS — E119 Type 2 diabetes mellitus without complications: Secondary | ICD-10-CM

## 2016-07-01 NOTE — Telephone Encounter (Signed)
Patient has not returned for follow up of her uncontrolled diabetes since January. Cannot refill medications until she scheduled follow up o.v.

## 2016-07-03 NOTE — Telephone Encounter (Signed)
Tried calling all of her of her numbers listed.  They are disconnect.  Work number did not recognize her name.    Thanks,   -Vernona RiegerLaura

## 2016-07-08 ENCOUNTER — Other Ambulatory Visit: Payer: Self-pay | Admitting: Family Medicine

## 2016-07-08 DIAGNOSIS — E119 Type 2 diabetes mellitus without complications: Secondary | ICD-10-CM

## 2017-07-12 ENCOUNTER — Ambulatory Visit (INDEPENDENT_AMBULATORY_CARE_PROVIDER_SITE_OTHER): Payer: Medicaid Other | Admitting: Family Medicine

## 2017-07-12 ENCOUNTER — Encounter: Payer: Self-pay | Admitting: Family Medicine

## 2017-07-12 VITALS — BP 140/104 | HR 70 | Temp 98.4°F | Resp 16 | Wt 200.0 lb

## 2017-07-12 DIAGNOSIS — E119 Type 2 diabetes mellitus without complications: Secondary | ICD-10-CM

## 2017-07-12 DIAGNOSIS — Z23 Encounter for immunization: Secondary | ICD-10-CM

## 2017-07-12 DIAGNOSIS — F411 Generalized anxiety disorder: Secondary | ICD-10-CM

## 2017-07-12 DIAGNOSIS — I10 Essential (primary) hypertension: Secondary | ICD-10-CM

## 2017-07-12 LAB — POCT GLYCOSYLATED HEMOGLOBIN (HGB A1C): HEMOGLOBIN A1C: 7.8

## 2017-07-12 MED ORDER — GLIPIZIDE 5 MG PO TABS: 5.0000 mg | ORAL_TABLET | Freq: Two times a day (BID) | ORAL | 2 refills | Status: DC

## 2017-07-12 MED ORDER — CLONAZEPAM 0.5 MG PO TABS: 0.5000 mg | ORAL_TABLET | Freq: Two times a day (BID) | ORAL | 0 refills | Status: DC | PRN

## 2017-07-12 MED ORDER — METFORMIN HCL 1000 MG PO TABS: 1000.0000 mg | ORAL_TABLET | Freq: Two times a day (BID) | ORAL | 3 refills | Status: DC

## 2017-07-12 NOTE — Progress Notes (Signed)
Subjective:     Patient ID: Gabrielle Owens, female   DOB: 12-Mar-1971, 46 y.o.   MRN: [id number]  HPI  Chief Complaint  Patient presents with  . Diabetes    Patient returns back to office to follow up after last being seen on 10/07/15. At last visit patients condition was controlled  and HgbA1C level was at 9.9%. Patient states in the past year she has seen a PCP in [place], [place] to follow up for condition and has been compliant staying at Metformin  BID. Most recent high sugar at home was 167, she denies any hypoglycemia incidents.   . Anxiety    Patient returns for follow up from 09/02/15 and was on Xanax 0.5mg  PRN. Patient reports that she discontinued medication over a year ago she states that she didnt need to take as much do to lifestyle changes at the time but now wants to disucss medication options to help with anxiety since she has moved back to [place]. Patient states that she has had family problems and is currently looking for work.   Reports her son is in jail on a domestic charge and her fiancee is bipolar. Previously on Xanax which she used on a prn basis. States she just updated with her cardiologist, Dr. Darrold Junker, but just resumed her bp medication 2-3 days ago. States she had labs done there. Accompanied by her daughter today.   Review of Systems     Objective:   Physical Exam  Constitutional: She appears well-developed and well-nourished. No distress.       Assessment:    1. Diabetes mellitus with no complication (HCC) - metFORMIN (GLUCOPHAGE) 1000 MG tablet; Take 1 tablet (1,000 mg total) by mouth 2 (two) times daily with a meal.  Dispense: 180 tablet; Refill: 3 - glipiZIDE (GLUCOTROL) 5 MG tablet; Take 1 tablet (5 mg total) by mouth 2 (two) times daily before a meal. Take 30 minutes before a meal  Dispense: 60 tablet; Refill: 2 - POCT glycosylated hemoglobin (Hb A1C)  2. Essential (primary) hypertension: per cardiology  3. Generalized anxiety disorder -  clonazePAM (KLONOPIN) 0.5 MG tablet; Take 1 tablet (0.5 mg total) by mouth 2 (two) times daily as needed for anxiety.  Dispense: 30 tablet; Refill: 0  4. Need for influenza vaccination - Flu Vaccine QUAD 36+ mos IM  5. Need for 23-polyvalent pneumococcal polysaccharide vaccine - Pneumococcal polysaccharide vaccine 23-valent greater than or equal to 2yo subcutaneous/IM    Plan:    Will f/u with her primary M.D., Dr. Sherrie Mustache. May call if new medications not tolerated.

## 2017-07-12 NOTE — Patient Instructions (Addendum)
Let us know before your office visit if your can't tolerate glipizide or clonazepam.

## 2017-07-26 ENCOUNTER — Telehealth: Payer: Self-pay | Admitting: Family Medicine

## 2017-07-26 NOTE — Telephone Encounter (Signed)
Attempted to call patient, phone answering service states that calls are not being accepted at this time. Will try and contact patient at a later time. KW

## 2017-07-26 NOTE — Telephone Encounter (Addendum)
Pt states she has been taking the Rx clonazePAM (KLONOPIN) 0.5 MG tablet for 2 weeks but it is only helping a little.  Pt is asking id the dosage can be changed.  [facility]  CB#[phone]/MW

## 2017-08-01 NOTE — Telephone Encounter (Signed)
Pt is requesting a call back.  CB#[phone]/MW

## 2017-08-02 ENCOUNTER — Other Ambulatory Visit: Payer: Self-pay | Admitting: Family Medicine

## 2017-08-02 DIAGNOSIS — F419 Anxiety disorder, unspecified: Secondary | ICD-10-CM

## 2017-08-02 MED ORDER — CLONAZEPAM 1 MG PO TABS: 1.0000 mg | ORAL_TABLET | Freq: Two times a day (BID) | ORAL | 0 refills | Status: DC | PRN

## 2017-08-02 NOTE — Telephone Encounter (Signed)
[id number] 

## 2017-08-02 NOTE — Progress Notes (Signed)
prescription called into pharmacy. KW 

## 2017-08-02 NOTE — Telephone Encounter (Signed)
Patient states that you instructed her to call office back if symptoms did not improve. Patient states that symptoms are still the same and she has panic attacks still, patient request that you increase dosage of medication. KW

## 2017-08-02 NOTE — Telephone Encounter (Signed)
Clonazepam increase 1 mg. Twice daily as needed.

## 2017-08-29 ENCOUNTER — Telehealth: Payer: Self-pay | Admitting: Family Medicine

## 2017-09-06 ENCOUNTER — Encounter: Payer: Self-pay | Admitting: Family Medicine

## 2017-09-06 ENCOUNTER — Ambulatory Visit: Payer: Medicaid Other | Admitting: Family Medicine

## 2017-09-06 VITALS — BP 138/94 | HR 88 | Temp 98.6°F | Resp 18 | Wt 197.0 lb

## 2017-09-06 DIAGNOSIS — J40 Bronchitis, not specified as acute or chronic: Secondary | ICD-10-CM

## 2017-09-06 DIAGNOSIS — J329 Chronic sinusitis, unspecified: Secondary | ICD-10-CM | POA: Diagnosis not present

## 2017-09-06 DIAGNOSIS — R05 Cough: Secondary | ICD-10-CM | POA: Diagnosis not present

## 2017-09-06 DIAGNOSIS — R059 Cough, unspecified: Secondary | ICD-10-CM

## 2017-09-06 MED ORDER — PREDNISONE 10 MG PO TABS: ORAL_TABLET | ORAL | 0 refills | Status: AC

## 2017-09-06 MED ORDER — LEVOFLOXACIN 500 MG PO TABS: 500.0000 mg | ORAL_TABLET | Freq: Every day | ORAL | 0 refills | Status: DC

## 2017-09-06 NOTE — Progress Notes (Signed)
Patient: Gabrielle Owens Female    DOB: 16-Jan-1971   46 y.o.   MRN: [id number] Visit Date: 09/06/2017  Today's Provider: Mila Merryonald Medford Staheli, MD   Chief Complaint  Patient presents with  . Cough   Subjective:    Patient has had a cough for 4 days. Cough is non-productive. Other symptoms include sob, wheezing, lightheadedness, sore throat, ear congestion, and sinus pain. Patient has been taking prednisone taper (she had from previous ov) and mucinex with no relief.   Cough  This is a new problem. The current episode started in the past 7 days (4 days). The problem has been gradually worsening. The problem occurs constantly. The cough is non-productive. Associated symptoms include ear congestion, headaches, nasal congestion, postnasal drip, rhinorrhea, a sore throat, sweats and wheezing. Pertinent negatives include no chest pain, chills, ear pain, fever, heartburn, hemoptysis, myalgias, rash, shortness of breath or weight loss. The symptoms are aggravated by lying down and exercise. Treatments tried: mucinex and prednisone. The treatment provided no relief. Her past medical history is significant for bronchitis and environmental allergies.   Started 60mg  prednisone on Sunday and tapered down to 30mg  as of yesterday. Is using albuterol in inhaler every 3-4 hours which provides temporary relief.     No Known Allergies   Current Outpatient Medications:  .  aspirin 81 MG tablet, Take 81 mg by mouth daily., Disp: , Rfl:  .  clonazePAM (KLONOPIN) 1 MG tablet, Take 1 tablet (1 mg total) by mouth 2 (two) times daily as needed for anxiety., Disp: 30 tablet, Rfl: 0 .  glipiZIDE (GLUCOTROL) 5 MG tablet, Take 1 tablet (5 mg total) by mouth 2 (two) times daily before a meal. Take 30 minutes before a meal, Disp: 60 tablet, Rfl: 2 .  hydrochlorothiazide (HYDRODIURIL) 25 MG tablet, HYDROCHLOROTHIAZIDE, 25MG  (Oral Tablet)  1 every day for 0 days  Quantity: 0.00;  Refills: 0   Ordered :31-Jan-2012   Janey GreasereSanto, [name] ;  Started 31-Dec-2008 Active, Disp: , Rfl:  .  losartan (COZAAR) 50 MG tablet, Take 1 tablet by mouth daily., Disp: , Rfl:  .  metFORMIN (GLUCOPHAGE) 1000 MG tablet, Take 1 tablet (1,000 mg total) by mouth 2 (two) times daily with a meal., Disp: 180 tablet, Rfl: 3 .  metoprolol (LOPRESSOR) 100 MG tablet, Take 1 tablet by mouth 2 (two) times daily., Disp: , Rfl:  .  PROAIR HFA 108 (90 Base) MCG/ACT inhaler, , Disp: , Rfl:  .  simvastatin (ZOCOR) 20 MG tablet, simvastatin 20 mg tablet, Disp: , Rfl:   Review of Systems  Constitutional: Positive for appetite change. Negative for chills, fatigue, fever and weight loss.  HENT: Positive for congestion, postnasal drip, rhinorrhea, sinus pain and sore throat. Negative for ear pain.   Respiratory: Positive for cough and wheezing. Negative for hemoptysis, chest tightness and shortness of breath.   Cardiovascular: Negative for chest pain and palpitations.  Gastrointestinal: Negative for abdominal pain, heartburn, nausea and vomiting.  Musculoskeletal: Negative for myalgias.  Skin: Negative for rash.  Allergic/Immunologic: Positive for environmental allergies.  Neurological: Positive for headaches. Negative for dizziness and weakness.    Social History   Tobacco Use  . Smoking status: Former Smoker    Packs/day: 1.00    Years: 24.00    Pack years: 24.00    Types: Cigarettes    Last attempt to quit: 03/03/2011    Years since quitting: 6.5  . Smokeless tobacco: Never Used  Substance Use Topics  .  Alcohol use: Yes    Alcohol/week: 0.0 oz    Comment: occasional   Objective:   BP (!) 138/94 (BP Location: Left Arm, Patient Position: Sitting, Cuff Size: Large)   Pulse 88   Temp 98.6 F (37 C) (Oral)   Resp 18   Wt 197 lb (89.4 kg)   SpO2 99%   BMI 36.03 kg/m  Vitals:   09/06/17 0853  BP: (!) 138/94  Pulse: 88  Resp: 18  Temp: 98.6 F (37 C)  TempSrc: Oral  SpO2: 99%  Weight: 197 lb (89.4 kg)     Physical  Exam  General Appearance:    Alert, cooperative, no distress  HENT:   bilateral TM normal without fluid or infection, neck without nodes, maxillary sinuses tender and nasal mucosa pale and congested  Eyes:    PERRL, conjunctiva/corneas clear, EOM's intact       Lungs:     Extensive expiratory wheezes, no rales,  respirations unlabored. Normal air movement.   Heart:    Regular rate and rhythm  Neurologic:   Awake, alert, oriented x 3. No apparent focal neurological           defect.           Assessment & Plan:     1. Sinusitis, unspecified chronicity, unspecified location  - levofloxacin (LEVAQUIN) 500 MG tablet; Take 1 tablet (500 mg total) by mouth daily.  Dispense: 7 tablet; Refill: 0  2. Cough Continue scheduled doses of albuterol inhaler and go back up to 60mg  and taper prednisone over 12 days.  - predniSONE (DELTASONE) 10 MG tablet; 6 tablets for 2 days, then 5 for 2 days, then 4 for 2 days, then 3 for 2 days, then 2 for 2 days, then 1 for 2 days.  Dispense: 42 tablet; Refill: 0  Call if symptoms change or if not rapidly improving.     3. Bronchitis  - levofloxacin (LEVAQUIN) 500 MG tablet; Take 1 tablet (500 mg total) by mouth daily.  Dispense: 7 tablet; Refill: 0 - predniSONE (DELTASONE) 10 MG tablet; 6 tablets for 2 days, then 5 for 2 days, then 4 for 2 days, then 3 for 2 days, then 2 for 2 days, then 1 for 2 days.  Dispense: 42 tablet; Refill: 0     The entirety of the information documented in the History of Present Illness, Review of Systems and Physical Exam were personally obtained by me. Portions of this information were initially documented by April M. Hyacinth MeekerMiller, CMA and reviewed by me for thoroughness and accuracy.   Mila Merryonald Janari Gagner, MD  [facility]

## 2017-10-12 ENCOUNTER — Ambulatory Visit: Payer: Self-pay | Admitting: Family Medicine

## 2017-10-19 ENCOUNTER — Ambulatory Visit: Payer: Self-pay | Admitting: Family Medicine

## 2017-11-22 ENCOUNTER — Telehealth: Payer: Self-pay | Admitting: Family Medicine

## 2017-11-22 DIAGNOSIS — E119 Type 2 diabetes mellitus without complications: Secondary | ICD-10-CM

## 2017-11-25 NOTE — Telephone Encounter (Signed)
Please check with pharmacy-should have two refills left and is overdue for  Diabetes followup. I don't know if she still sees Dr.Fisher.

## 2017-11-26 NOTE — Telephone Encounter (Signed)
Spoke with pharmacist who states that patient does have an active refill and will refill it today for patient to pick up. KW

## 2017-12-04 NOTE — Telephone Encounter (Signed)
We received a refill request for the patient.  I tried to call pt to have her come in.  No answer and no message was able to be left. Will let pharmacy know pt needs an appt.

## 2017-12-14 ENCOUNTER — Other Ambulatory Visit: Payer: Self-pay | Admitting: Family Medicine

## 2017-12-14 DIAGNOSIS — E119 Type 2 diabetes mellitus without complications: Secondary | ICD-10-CM

## 2017-12-17 NOTE — Telephone Encounter (Signed)
See refill request.

## 2018-02-15 ENCOUNTER — Other Ambulatory Visit: Payer: Self-pay | Admitting: Family Medicine

## 2018-02-15 DIAGNOSIS — E119 Type 2 diabetes mellitus without complications: Secondary | ICD-10-CM

## 2018-03-18 ENCOUNTER — Encounter: Payer: Self-pay | Admitting: Physician Assistant

## 2018-03-24 ENCOUNTER — Other Ambulatory Visit: Payer: Self-pay | Admitting: Family Medicine

## 2018-03-24 DIAGNOSIS — E119 Type 2 diabetes mellitus without complications: Secondary | ICD-10-CM

## 2018-04-13 ENCOUNTER — Other Ambulatory Visit: Payer: Self-pay | Admitting: Family Medicine

## 2018-04-13 DIAGNOSIS — F419 Anxiety disorder, unspecified: Secondary | ICD-10-CM

## 2018-04-15 ENCOUNTER — Encounter: Payer: Self-pay | Admitting: Physician Assistant

## 2018-04-15 NOTE — Telephone Encounter (Signed)
See refill request.

## 2018-04-18 ENCOUNTER — Ambulatory Visit: Payer: Self-pay | Admitting: Family Medicine

## 2018-04-18 NOTE — Progress Notes (Deleted)
Patient: Gabrielle Owens Female    DOB: 01/28/1971   47 y.o.   MRN: [id number] Visit Date: 04/18/2018  Today's Provider: Mila Merryonald Fisher, MD   No chief complaint on file.  Subjective:    HPI   Diabetes Mellitus Type II, Follow-up:   Lab Results  Component Value Date   HGBA1C 7.8 07/12/2017   HGBA1C 9.9 10/07/2015   HGBA1C 8.2 09/02/2015   Last seen for diabetes 9 months ago.  Management since then includes; seen by Toni ArthursBob Chauvin. Added glipizide 5 mg qd. She reports {excellent/good/fair/poor:19665} compliance with treatment. She {ACTION; IS/IS [id number]}OT:[id number]} having side effects. *** Current symptoms include {Symptoms; diabetes:14075} and have been {Desc; course:15616}. Home blood sugar records: {diabetes glucometry results:16657}  Episodes of hypoglycemia? {yes***/no:17258}   Current Insulin Regimen: *** Most Recent Eye Exam: *** Weight trend: {trend:16658} Prior visit with dietician: {yes/no:17258} Current diet: {diet habits:16563} Current exercise: {exercise types:16438}  ------------------------------------------------------------------------   Hypertension, follow-up:  BP Readings from Last 3 Encounters:  09/06/17 (!) 138/94  07/12/17 (!) 140/104  10/07/15 (!) 138/98    She was last seen for hypertension 9 months ago.  BP at that visit was 140/104. Management since that visit includes; no changes. Followed by cardiology.She reports {excellent/good/fair/poor:19665} compliance with treatment. She {ACTION; IS/IS [id number]}OT:[id number]} having side effects. *** She {is/is not:9024} exercising. She {is/is not:9024} adherent to low salt diet.   Outside blood pressures are ***. She is experiencing {Symptoms; cardiac:12860}.  Patient denies {Symptoms; cardiac:12860}.   Cardiovascular risk factors include {cv risk factors:510}.  Use of agents associated with hypertension: {bp agents assoc with hypertension:511::"none"}.    ------------------------------------------------------------------------  Generalized anxiety disorder From seen by Toni ArthursBob Chauvin. Started clonazepam 0.5 MG tablet. Changes since last ov, increased clonazepam to 1 mg bid prn.    No Known Allergies   Current Outpatient Medications:  .  aspirin 81 MG tablet, Take 81 mg by mouth daily., Disp: , Rfl:  .  clonazePAM (KLONOPIN) 1 MG tablet, TAKE 1 TABLET BY MOUTH TWICE A DAY AS NEEDED FOR ANXIETY, Disp: 30 tablet, Rfl: 0 .  glipiZIDE (GLUCOTROL) 5 MG tablet, TAKE 1 TABLET (5 MG TOTAL) BY MOUTH 2 (TWO) TIMES DAILY BEFORE A MEAL., Disp: 30 tablet, Rfl: 0 .  hydrochlorothiazide (HYDRODIURIL) 25 MG tablet, HYDROCHLOROTHIAZIDE, 25MG  (Oral Tablet)  1 every day for 0 days  Quantity: 0.00;  Refills: 0   Ordered :31-Jan-2012  Janey GreasereSanto, [name] ;  Started 31-Dec-2008 Active, Disp: , Rfl:  .  losartan (COZAAR) 50 MG tablet, Take 1 tablet by mouth daily., Disp: , Rfl:  .  metFORMIN (GLUCOPHAGE) 1000 MG tablet, Take 1 tablet (1,000 mg total) by mouth 2 (two) times daily with a meal., Disp: 180 tablet, Rfl: 3 .  metoprolol (LOPRESSOR) 100 MG tablet, Take 1 tablet by mouth 2 (two) times daily., Disp: , Rfl:  .  PROAIR HFA 108 (90 Base) MCG/ACT inhaler, , Disp: , Rfl:  .  simvastatin (ZOCOR) 20 MG tablet, simvastatin 20 mg tablet, Disp: , Rfl:   Review of Systems  Constitutional: Negative for appetite change, chills, fatigue and fever.  Respiratory: Negative for chest tightness and shortness of breath.   Cardiovascular: Negative for chest pain and palpitations.  Gastrointestinal: Negative for abdominal pain, nausea and vomiting.  Neurological: Negative for dizziness and weakness.    Social History   Tobacco Use  . Smoking status: Former Smoker    Packs/day: 1.00    Years: 24.00    Pack years: 24.00  Types: Cigarettes    Last attempt to quit: 03/03/2011    Years since quitting: 7.1  . Smokeless tobacco: Never Used  Substance Use Topics  . Alcohol  use: Yes    Alcohol/week: 0.0 oz    Comment: occasional   Objective:   There were no vitals taken for this visit. There were no vitals filed for this visit.   Physical Exam      Assessment & Plan:           Mila Merry, MD  [facility]

## 2018-04-29 ENCOUNTER — Ambulatory Visit: Payer: Self-pay | Admitting: Family Medicine

## 2018-04-29 ENCOUNTER — Telehealth: Payer: Self-pay | Admitting: Family Medicine

## 2018-04-29 DIAGNOSIS — I10 Essential (primary) hypertension: Secondary | ICD-10-CM

## 2018-04-29 NOTE — Telephone Encounter (Signed)
Pt stated that her BP has been elevated over the weekend as high as 200/111. Pt stated she contacted [facility] this morning to see her cardiologist Dr. Darrold JunkerParaschos. [facility] called to get a referral and was advised pt needed OV for referral and was scheduled for OV with Dr. Sherrie MustacheFisher @ 3 today. Pt called b/c she doesn't feel she can wait until 3 today and Dr. Sherrie MustacheFisher doesn't follow pt for cardio. Pt is requesting a referral be sent over asap so pt can see Dr. Darrold JunkerParaschos this morning. Pt has Medicaid and that is why [facility] is requiring referral before they will see pt again. Please advise. Thanks TNP

## 2018-04-29 NOTE — Progress Notes (Deleted)
       Patient: Wyvonnia LoraChantel G Lineberry Female    DOB: 21-Sep-1971   47 y.o.   MRN: [id number] Visit Date: 04/29/2018  Today's Provider: Mila Merryonald Fisher, MD   No chief complaint on file.  Subjective:    HPI     No Known Allergies   Current Outpatient Medications:  .  aspirin 81 MG tablet, Take 81 mg by mouth daily., Disp: , Rfl:  .  clonazePAM (KLONOPIN) 1 MG tablet, TAKE 1 TABLET BY MOUTH TWICE A DAY AS NEEDED FOR ANXIETY, Disp: 30 tablet, Rfl: 0 .  glipiZIDE (GLUCOTROL) 5 MG tablet, TAKE 1 TABLET (5 MG TOTAL) BY MOUTH 2 (TWO) TIMES DAILY BEFORE A MEAL., Disp: 30 tablet, Rfl: 0 .  hydrochlorothiazide (HYDRODIURIL) 25 MG tablet, HYDROCHLOROTHIAZIDE, 25MG  (Oral Tablet)  1 every day for 0 days  Quantity: 0.00;  Refills: 0   Ordered :31-Jan-2012  Janey GreasereSanto, [name] ;  Started 31-Dec-2008 Active, Disp: , Rfl:  .  losartan (COZAAR) 50 MG tablet, Take 1 tablet by mouth daily., Disp: , Rfl:  .  metFORMIN (GLUCOPHAGE) 1000 MG tablet, Take 1 tablet (1,000 mg total) by mouth 2 (two) times daily with a meal., Disp: 180 tablet, Rfl: 3 .  metoprolol (LOPRESSOR) 100 MG tablet, Take 1 tablet by mouth 2 (two) times daily., Disp: , Rfl:  .  PROAIR HFA 108 (90 Base) MCG/ACT inhaler, , Disp: , Rfl:  .  simvastatin (ZOCOR) 20 MG tablet, simvastatin 20 mg tablet, Disp: , Rfl:   Review of Systems  Constitutional: Negative for appetite change, chills, fatigue and fever.  Respiratory: Negative for chest tightness and shortness of breath.   Cardiovascular: Negative for chest pain and palpitations.  Gastrointestinal: Negative for abdominal pain, nausea and vomiting.  Neurological: Negative for dizziness and weakness.    Social History   Tobacco Use  . Smoking status: Former Smoker    Packs/day: 1.00    Years: 24.00    Pack years: 24.00    Types: Cigarettes    Last attempt to quit: 03/03/2011    Years since quitting: 7.1  . Smokeless tobacco: Never Used  Substance Use Topics  . Alcohol use: Yes   Alcohol/week: 0.0 oz    Comment: occasional   Objective:   There were no vitals taken for this visit. There were no vitals filed for this visit.   Physical Exam      Assessment & Plan:           Mila Merryonald Fisher, MD  [facility]

## 2018-04-29 NOTE — Telephone Encounter (Signed)
Ok to send referral if [facility] actually agreeS to see her this morning. They usually don't see patients so quickly. Have sent referral to [facility]. Alternative patient could come here at 11:40. If sh has any headache, chest pains, or shortness of breath then she needs to go to ER.

## 2018-04-29 NOTE — Telephone Encounter (Addendum)
Patient was advised and has already scheduled appt with cardiology for today. Patient scheduled the 3:00 appt she had scheduled with our office.

## 2018-04-29 NOTE — Telephone Encounter (Signed)
Please advise 

## 2018-05-01 ENCOUNTER — Ambulatory Visit (INDEPENDENT_AMBULATORY_CARE_PROVIDER_SITE_OTHER): Payer: Medicaid Other | Admitting: Physician Assistant

## 2018-05-01 ENCOUNTER — Other Ambulatory Visit (HOSPITAL_COMMUNITY)
Admission: RE | Admit: 2018-05-01 | Discharge: 2018-05-01 | Disposition: A | Discharge status: Home / Self Care | Payer: Medicaid Other | Source: Ambulatory Visit | Attending: Physician Assistant | Admitting: Physician Assistant

## 2018-05-01 ENCOUNTER — Encounter: Payer: Self-pay | Admitting: Physician Assistant

## 2018-05-01 VITALS — BP 140/88 | HR 94 | Temp 98.1°F | Resp 16 | Wt 188.0 lb

## 2018-05-01 DIAGNOSIS — Z1231 Encounter for screening mammogram for malignant neoplasm of breast: Secondary | ICD-10-CM | POA: Diagnosis not present

## 2018-05-01 DIAGNOSIS — E781 Pure hyperglyceridemia: Secondary | ICD-10-CM | POA: Diagnosis not present

## 2018-05-01 DIAGNOSIS — Z0000 Encounter for general adult medical examination without abnormal findings: Secondary | ICD-10-CM | POA: Insufficient documentation

## 2018-05-01 DIAGNOSIS — K580 Irritable bowel syndrome with diarrhea: Secondary | ICD-10-CM

## 2018-05-01 DIAGNOSIS — R87810 Cervical high risk human papillomavirus (HPV) DNA test positive: Secondary | ICD-10-CM

## 2018-05-01 DIAGNOSIS — I10 Essential (primary) hypertension: Secondary | ICD-10-CM

## 2018-05-01 DIAGNOSIS — Z124 Encounter for screening for malignant neoplasm of cervix: Secondary | ICD-10-CM | POA: Diagnosis not present

## 2018-05-01 DIAGNOSIS — E119 Type 2 diabetes mellitus without complications: Secondary | ICD-10-CM

## 2018-05-01 DIAGNOSIS — F411 Generalized anxiety disorder: Secondary | ICD-10-CM | POA: Insufficient documentation

## 2018-05-01 DIAGNOSIS — Z1239 Encounter for other screening for malignant neoplasm of breast: Secondary | ICD-10-CM

## 2018-05-01 DIAGNOSIS — Z6834 Body mass index (BMI) 34.0-34.9, adult: Secondary | ICD-10-CM

## 2018-05-01 MED ORDER — DICYCLOMINE HCL 10 MG PO CAPS: 10.0000 mg | ORAL_CAPSULE | Freq: Three times a day (TID) | ORAL | 1 refills | Status: DC

## 2018-05-01 MED ORDER — METFORMIN HCL 1000 MG PO TABS: 1000.0000 mg | ORAL_TABLET | Freq: Two times a day (BID) | ORAL | 3 refills | Status: DC

## 2018-05-01 MED ORDER — VENLAFAXINE HCL ER 75 MG PO CP24: 75.0000 mg | ORAL_CAPSULE | Freq: Every day | ORAL | 1 refills | Status: DC

## 2018-05-01 MED ORDER — GLIPIZIDE 5 MG PO TABS: 5.0000 mg | ORAL_TABLET | Freq: Two times a day (BID) | ORAL | 3 refills | Status: DC

## 2018-05-01 NOTE — Patient Instructions (Signed)
Health Maintenance, Female Adopting a healthy lifestyle and getting preventive care can go a long way to promote health and wellness. Talk with your health care provider about what schedule of regular examinations is right for you. This is a good chance for you to check in with your provider about disease prevention and staying healthy. In between checkups, there are plenty of things you can do on your own. Experts have done a lot of research about which lifestyle changes and preventive measures are most likely to keep you healthy. Ask your health care provider for more information. Weight and diet Eat a healthy diet  Be sure to include plenty of vegetables, fruits, low-fat dairy products, and lean protein.  Do not eat a lot of foods high in solid fats, added sugars, or salt.  Get regular exercise. This is one of the most important things you can do for your health. ? Most adults should exercise for at least 150 minutes each week. The exercise should increase your heart rate and make you sweat (moderate-intensity exercise). ? Most adults should also do strengthening exercises at least twice a week. This is in addition to the moderate-intensity exercise.  Maintain a healthy weight  Body mass index (BMI) is a measurement that can be used to identify possible weight problems. It estimates body fat based on height and weight. Your health care provider can help determine your BMI and help you achieve or maintain a healthy weight.  For females 20 years of age and older: ? A BMI below 18.5 is considered underweight. ? A BMI of 18.5 to 24.9 is normal. ? A BMI of 25 to 29.9 is considered overweight. ? A BMI of 30 and above is considered obese.  Watch levels of cholesterol and blood lipids  You should start having your blood tested for lipids and cholesterol at 48 years of age, then have this test every 5 years.  You may need to have your cholesterol levels checked more often if: ? Your lipid or  cholesterol levels are high. ? You are older than 48 years of age. ? You are at high risk for heart disease.  Cancer screening Lung Cancer  Lung cancer screening is recommended for adults 55-80 years old who are at high risk for lung cancer because of a history of smoking.  A yearly low-dose CT scan of the lungs is recommended for people who: ? Currently smoke. ? Have quit within the past 15 years. ? Have at least a 30-pack-year history of smoking. A pack year is smoking an average of one pack of cigarettes a day for 1 year.  Yearly screening should continue until it has been 15 years since you quit.  Yearly screening should stop if you develop a health problem that would prevent you from having lung cancer treatment.  Breast Cancer  Practice breast self-awareness. This means understanding how your breasts normally appear and feel.  It also means doing regular breast self-exams. Let your health care provider know about any changes, no matter how small.  If you are in your 20s or 30s, you should have a clinical breast exam (CBE) by a health care provider every 1-3 years as part of a regular health exam.  If you are 40 or older, have a CBE every year. Also consider having a breast X-ray (mammogram) every year.  If you have a family history of breast cancer, talk to your health care provider about genetic screening.  If you are at high risk   for breast cancer, talk to your health care provider about having an MRI and a mammogram every year.  Breast cancer gene (BRCA) assessment is recommended for women who have family members with BRCA-related cancers. BRCA-related cancers include: ? Breast. ? Ovarian. ? Tubal. ? Peritoneal cancers.  Results of the assessment will determine the need for genetic counseling and BRCA1 and BRCA2 testing.  Cervical Cancer Your health care provider may recommend that you be screened regularly for cancer of the pelvic organs (ovaries, uterus, and  vagina). This screening involves a pelvic examination, including checking for microscopic changes to the surface of your cervix (Pap test). You may be encouraged to have this screening done every 3 years, beginning at age 22.  For women ages 56-65, health care providers may recommend pelvic exams and Pap testing every 3 years, or they may recommend the Pap and pelvic exam, combined with testing for human papilloma virus (HPV), every 5 years. Some types of HPV increase your risk of cervical cancer. Testing for HPV may also be done on women of any age with unclear Pap test results.  Other health care providers may not recommend any screening for nonpregnant women who are considered low risk for pelvic cancer and who do not have symptoms. Ask your health care provider if a screening pelvic exam is right for you.  If you have had past treatment for cervical cancer or a condition that could lead to cancer, you need Pap tests and screening for cancer for at least 20 years after your treatment. If Pap tests have been discontinued, your risk factors (such as having a new sexual partner) need to be reassessed to determine if screening should resume. Some women have medical problems that increase the chance of getting cervical cancer. In these cases, your health care provider may recommend more frequent screening and Pap tests.  Colorectal Cancer  This type of cancer can be detected and often prevented.  Routine colorectal cancer screening usually begins at 48 years of age and continues through 48 years of age.  Your health care provider may recommend screening at an earlier age if you have risk factors for colon cancer.  Your health care provider may also recommend using home test kits to check for hidden blood in the stool.  A small camera at the end of a tube can be used to examine your colon directly (sigmoidoscopy or colonoscopy). This is done to check for the earliest forms of colorectal  cancer.  Routine screening usually begins at age 33.  Direct examination of the colon should be repeated every 5-10 years through 48 years of age. However, you may need to be screened more often if early forms of precancerous polyps or small growths are found.  Skin Cancer  Check your skin from head to toe regularly.  Tell your health care provider about any new moles or changes in moles, especially if there is a change in a mole's shape or color.  Also tell your health care provider if you have a mole that is larger than the size of a pencil eraser.  Always use sunscreen. Apply sunscreen liberally and repeatedly throughout the day.  Protect yourself by wearing long sleeves, pants, a wide-brimmed hat, and sunglasses whenever you are outside.  Heart disease, diabetes, and high blood pressure  High blood pressure causes heart disease and increases the risk of stroke. High blood pressure is more likely to develop in: ? People who have blood pressure in the high end of  the normal range (130-139/85-89 mm Hg). ? People who are overweight or obese. ? People who are African American.  If you are 21-29 years of age, have your blood pressure checked every 3-5 years. If you are 3 years of age or older, have your blood pressure checked every year. You should have your blood pressure measured twice-once when you are at a hospital or clinic, and once when you are not at a hospital or clinic. Record the average of the two measurements. To check your blood pressure when you are not at a hospital or clinic, you can use: ? An automated blood pressure machine at a pharmacy. ? A home blood pressure monitor.  If you are between 17 years and 37 years old, ask your health care provider if you should take aspirin to prevent strokes.  Have regular diabetes screenings. This involves taking a blood sample to check your fasting blood sugar level. ? If you are at a normal weight and have a low risk for diabetes,  have this test once every three years after 48 years of age. ? If you are overweight and have a high risk for diabetes, consider being tested at a younger age or more often. Preventing infection Hepatitis B  If you have a higher risk for hepatitis B, you should be screened for this virus. You are considered at high risk for hepatitis B if: ? You were born in a country where hepatitis B is common. Ask your health care provider which countries are considered high risk. ? Your parents were born in a high-risk country, and you have not been immunized against hepatitis B (hepatitis B vaccine). ? You have HIV or AIDS. ? You use needles to inject street drugs. ? You live with someone who has hepatitis B. ? You have had sex with someone who has hepatitis B. ? You get hemodialysis treatment. ? You take certain medicines for conditions, including cancer, organ transplantation, and autoimmune conditions.  Hepatitis C  Blood testing is recommended for: ? Everyone born from [birth year] through 1966. ? Anyone with known risk factors for hepatitis C.  Sexually transmitted infections (STIs)  You should be screened for sexually transmitted infections (STIs) including gonorrhea and chlamydia if: ? You are sexually active and are younger than 48 years of age. ? You are older than 48 years of age and your health care provider tells you that you are at risk for this type of infection. ? Your sexual activity has changed since you were last screened and you are at an increased risk for chlamydia or gonorrhea. Ask your health care provider if you are at risk.  If you do not have HIV, but are at risk, it may be recommended that you take a prescription medicine daily to prevent HIV infection. This is called pre-exposure prophylaxis (PrEP). You are considered at risk if: ? You are sexually active and do not regularly use condoms or know the HIV status of your partner(s). ? You take drugs by injection. ? You are  sexually active with a partner who has HIV.  Talk with your health care provider about whether you are at high risk of being infected with HIV. If you choose to begin PrEP, you should first be tested for HIV. You should then be tested every 3 months for as long as you are taking PrEP. Pregnancy  If you are premenopausal and you may become pregnant, ask your health care provider about preconception counseling.  If you may become  pregnant, take 400 to 800 micrograms (mcg) of folic acid every day.  If you want to prevent pregnancy, talk to your health care provider about birth control (contraception). Osteoporosis and menopause  Osteoporosis is a disease in which the bones lose minerals and strength with aging. This can result in serious bone fractures. Your risk for osteoporosis can be identified using a bone density scan.  If you are 28 years of age or older, or if you are at risk for osteoporosis and fractures, ask your health care provider if you should be screened.  Ask your health care provider whether you should take a calcium or vitamin D supplement to lower your risk for osteoporosis.  Menopause may have certain physical symptoms and risks.  Hormone replacement therapy may reduce some of these symptoms and risks. Talk to your health care provider about whether hormone replacement therapy is right for you. Follow these instructions at home:  Schedule regular health, dental, and eye exams.  Stay current with your immunizations.  Do not use any tobacco products including cigarettes, chewing tobacco, or electronic cigarettes.  If you are pregnant, do not drink alcohol.  If you are breastfeeding, limit how much and how often you drink alcohol.  Limit alcohol intake to no more than 1 drink per day for nonpregnant women. One drink equals 12 ounces of beer, 5 ounces of wine, or 1 ounces of hard liquor.  Do not use street drugs.  Do not share needles.  Ask your health care  provider for help if you need support or information about quitting drugs.  Tell your health care provider if you often feel depressed.  Tell your health care provider if you have ever been abused or do not feel safe at home. This information is not intended to replace advice given to you by your health care provider. Make sure you discuss any questions you have with your health care provider. Document Released: 04/03/2011 Document Revised: 02/24/2016 Document Reviewed: 06/22/2015 Elsevier Interactive Patient Education  2018 [employer]. Venlafaxine extended-release capsules What is this medicine? VENLAFAXINE(VEN la fax een) is used to treat depression, anxiety and panic disorder. This medicine may be used for other purposes; ask your health care provider or pharmacist if you have questions. COMMON BRAND NAME(S): Effexor XR What should I tell my health care provider before I take this medicine? They need to know if you have any of these conditions: -bleeding disorders -glaucoma -heart disease -high blood pressure -high cholesterol -kidney disease -liver disease -low levels of sodium in the blood -mania or bipolar disorder -seizures -suicidal thoughts, plans, or attempt; a previous suicide attempt by you or a family -take medicines that treat or prevent blood clots -thyroid disease -an unusual or allergic reaction to venlafaxine, desvenlafaxine, other medicines, foods, dyes, or preservatives -pregnant or trying to get pregnant -breast-feeding How should I use this medicine? Take this medicine by mouth with a full glass of water. Follow the directions on the prescription label. Do not cut, crush, or chew this medicine. Take it with food. If needed, the capsule may be carefully opened and the entire contents sprinkled on a spoonful of cool applesauce. Swallow the applesauce/pellet mixture right away without chewing and follow with a glass of water to ensure complete swallowing of the  pellets. Try to take your medicine at about the same time each day. Do not take your medicine more often than directed. Do not stop taking this medicine suddenly except upon the advice of your doctor. Stopping  this medicine too quickly may cause serious side effects or your condition may worsen. A special MedGuide will be given to you by the pharmacist with each prescription and refill. Be sure to read this information carefully each time. Talk to your pediatrician regarding the use of this medicine in children. Special care may be needed. Overdosage: If you think you have taken too much of this medicine contact a poison control center or emergency room at once. NOTE: This medicine is only for you. Do not share this medicine with others. What if I miss a dose? If you miss a dose, take it as soon as you can. If it is almost time for your next dose, take only that dose. Do not take double or extra doses. What may interact with this medicine? Do not take this medicine with any of the following medications: -certain medicines for fungal infections like fluconazole, itraconazole, ketoconazole, posaconazole, voriconazole -cisapride -desvenlafaxine -dofetilide -dronedarone -duloxetine -levomilnacipran -linezolid -MAOIs like Carbex, Eldepryl, Marplan, Nardil, and Parnate -methylene blue (injected into a vein) -milnacipran -pimozide -thioridazine -ziprasidone This medicine may also interact with the following medications: -amphetamines -aspirin and aspirin-like medicines -certain medicines for depression, anxiety, or psychotic disturbances -certain medicines for migraine headaches like almotriptan, eletriptan, frovatriptan, naratriptan, rizatriptan, sumatriptan, zolmitriptan -certain medicines for sleep -certain medicines that treat or prevent blood clots like dalteparin, enoxaparin,  warfarin -cimetidine -clozapine -diuretics -fentanyl -furazolidone -indinavir -isoniazid -lithium -metoprolol -NSAIDS, medicines for pain and inflammation, like ibuprofen or naproxen -other medicines that prolong the QT interval (cause an abnormal heart rhythm) -procarbazine -rasagiline -supplements like St. John's wort, kava kava, valerian -tramadol -tryptophan This list may not describe all possible interactions. Give your health care provider a list of all the medicines, herbs, non-prescription drugs, or dietary supplements you use. Also tell them if you smoke, drink alcohol, or use illegal drugs. Some items may interact with your medicine. What should I watch for while using this medicine? Tell your doctor if your symptoms do not get better or if they get worse. Visit your doctor or health care professional for regular checks on your progress. Because it may take several weeks to see the full effects of this medicine, it is important to continue your treatment as prescribed by your doctor. Patients and their families should watch out for new or worsening thoughts of suicide or depression. Also watch out for sudden changes in feelings such as feeling anxious, agitated, panicky, irritable, hostile, aggressive, impulsive, severely restless, overly excited and hyperactive, or not being able to sleep. If this happens, especially at the beginning of treatment or after a change in dose, call your health care professional. This medicine can cause an increase in blood pressure. Check with your doctor for instructions on monitoring your blood pressure while taking this medicine. You may get drowsy or dizzy. Do not drive, use machinery, or do anything that needs mental alertness until you know how this medicine affects you. Do not stand or sit up quickly, especially if you are an older patient. This reduces the risk of dizzy or fainting spells. Alcohol may interfere with the effect of this medicine.  Avoid alcoholic drinks. Your mouth may get dry. Chewing sugarless gum, sucking hard candy and drinking plenty of water will help. Contact your doctor if the problem does not go away or is severe. What side effects may I notice from receiving this medicine? Side effects that you should report to your doctor or health care professional as soon as possible: -allergic reactions like  skin rash, itching or hives, swelling of the face, lips, or tongue -anxious -breathing problems -confusion -changes in vision -chest pain -confusion -elevated mood, decreased need for sleep, racing thoughts, impulsive behavior -eye pain -fast, irregular heartbeat -feeling faint or lightheaded, falls -feeling agitated, angry, or irritable -hallucination, loss of contact with reality -high blood pressure -loss of balance or coordination -palpitations -redness, blistering, peeling or loosening of the skin, including inside the mouth -restlessness, pacing, inability to keep still -seizures -stiff muscles -suicidal thoughts or other mood changes -trouble passing urine or change in the amount of urine -trouble sleeping -unusual bleeding or bruising -unusually weak or tired -vomiting Side effects that usually do not require medical attention (report to your doctor or health care professional if they continue or are bothersome): -change in sex drive or performance -change in appetite or weight -constipation -dizziness -dry mouth -headache -increased sweating -nausea -tired This list may not describe all possible side effects. Call your doctor for medical advice about side effects. You may report side effects to FDA at 1-800-FDA-1088. Where should I keep my medicine? Keep out of the reach of children. Store at a controlled temperature between 20 and 25 degrees C (68 degrees and 77 degrees F), in a dry place. Throw away any unused medicine after the expiration date. NOTE: This sheet is a summary. It may not  cover all possible information. If you have questions about this medicine, talk to your doctor, pharmacist, or health care provider.  2018 Elsevier/Gold Standard (2016-02-17 18:38:02) Dicyclomine tablets or capsules What is this medicine? DICYCLOMINE (dye SYE kloe meen) is used to treat bowel problems including irritable bowel syndrome. This medicine may be used for other purposes; ask your health care provider or pharmacist if you have questions. COMMON BRAND NAME(S): Bentyl What should I tell my health care provider before I take this medicine? They need to know if you have any of these conditions: -difficulty passing urine -esophagus problems or heartburn -glaucoma -heart disease, or previous heart attack -myasthenia gravis -prostate trouble -stomach infection, or obstruction -ulcerative colitis -an unusual or allergic reaction to dicyclomine, other medicines, foods, dyes, or preservatives -pregnant or trying to get pregnant -breast-feeding How should I use this medicine? Take this medicine by mouth with a glass of water. Follow the directions on the prescription label. It is best to take this medicine on an empty stomach, 30 minutes to 1 hour before meals. Take your medicine at regular intervals. Do not take your medicine more often than directed. Talk to your pediatrician regarding the use of this medicine in children. Special care may be needed. While this drug may be prescribed for children as young as 93 months of age for selected conditions, precautions do apply. Patients over 89 years old may have a stronger reaction and need a smaller dose. Overdosage: If you think you have taken too much of this medicine contact a poison control center or emergency room at once. NOTE: This medicine is only for you. Do not share this medicine with others. What if I miss a dose? If you miss a dose, take it as soon as you can. If it is almost time for your next dose, take only that dose. Do not  take double or extra doses. What may interact with this medicine? -amantadine -antacids -benztropine -digoxin -disopyramide -medicines for allergies, colds and breathing difficulties -medicines for alzheimer's disease -medicines for anxiety or sleeping problems -medicines for depression or psychotic disturbances -medicines for diarrhea -medicines for pain -metoclopramide -tegaserod This  list may not describe all possible interactions. Give your health care provider a list of all the medicines, herbs, non-prescription drugs, or dietary supplements you use. Also tell them if you smoke, drink alcohol, or use illegal drugs. Some items may interact with your medicine. What should I watch for while using this medicine? You may get drowsy, dizzy, or have blurred vision. Do not drive, use machinery, or do anything that needs mental alertness until you know how this medicine affects you. To reduce the risk of dizzy or fainting spells, do not sit or stand up quickly, especially if you are an older patient. Alcohol can make you more drowsy, avoid alcoholic drinks. Stay out of bright light and wear sunglasses if this medicine makes your eyes more sensitive to light. Avoid extreme heat (hot tubs, saunas). This medicine can cause you to sweat less than normal. Your body temperature could increase to dangerous levels, which may lead to heat stroke. Antacids can stop this medicine from working. If you get an upset stomach and want to take an antacid, make sure there is an interval of at least 1 to 2 hours before or after you take this medicine. Your mouth may get dry. Chewing sugarless gum or sucking hard candy, and drinking plenty of water may help. Contact your doctor if the problem does not go away or is severe. What side effects may I notice from receiving this medicine? Side effects that you should report to your doctor or health care professional as soon as possible: -agitation, nervousness,  confusion -difficulty swallowing -dizziness, drowsiness -fast or slow heartbeat -hallucinations -pain or difficulty passing urine Side effects that usually do not require medical attention (report to your doctor or health care professional if they continue or are bothersome): -constipation -headache -nausea or vomiting -sexual difficulty This list may not describe all possible side effects. Call your doctor for medical advice about side effects. You may report side effects to FDA at 1-800-FDA-1088. Where should I keep my medicine? Keep out of the reach of children. Store at room temperature below 30 degrees C (86 degrees F). Protect from light. Throw away any unused medicine after the expiration date. NOTE: This sheet is a summary. It may not cover all possible information. If you have questions about this medicine, talk to your doctor, pharmacist, or health care provider.  2018 Elsevier/Gold Standard (2008-01-07 17:12:34)

## 2018-05-01 NOTE — Progress Notes (Signed)
Patient: Gabrielle Owens, Female    DOB: July 24, 1971, 47 y.o.   MRN: [id number] Visit Date: 05/01/2018  Today's Provider: Margaretann Loveless, PA-C   Chief Complaint  Patient presents with  . Annual Exam   Subjective:    Annual physical exam Gabrielle Owens is a 47 y.o. female who presents today for health maintenance and complete physical. She feels fairly well. She reports exercising none. She reports she is sleeping too much.  Patient reports that she was told by Sharen Hint, FNP that her HPV was Positive and that she needed to check every 6 months. Reports that she was referred to [facility]. However upon chart review her pap in 2015 was negative, HPV negative.   Patient reports that when she started to take the Klonopin every day she started to start worsening symptoms. Reports that her daughter was just diagnosed with cervical cancer due to a positive HPV. She feels she is more depressed. She also reports that she has pelvic pain during intimacy.  She also c/o of having diarrhea is every day. She reports that is watery and stringy. She reports she can get constipated once or twice a month. Reports the diarrhea is everyday for past 5-6 months.   Review of Systems  Constitutional: Positive for diaphoresis and fatigue.       "crying",Irritability  HENT: Negative.   Eyes: Negative.   Respiratory: Negative.   Cardiovascular: Negative.   Gastrointestinal: Positive for abdominal distention, abdominal pain, constipation and diarrhea.  Endocrine: Positive for polydipsia.  Genitourinary: Positive for enuresis ("coughing and sneezing") and vaginal pain.  Musculoskeletal: Negative.   Skin: Negative.   Allergic/Immunologic: Negative.   Neurological: Negative.   Hematological: Negative.   Psychiatric/Behavioral: Positive for agitation, decreased concentration, dysphoric mood and sleep disturbance. The patient is nervous/anxious.     Social History      She   reports that she quit smoking about 7 years ago. Her smoking use included cigarettes. She has a 24.00 pack-year smoking history. She has never used smokeless tobacco. She reports that she drinks alcohol. She reports that she does not use drugs.       Social History   Socioeconomic History  . Marital status: Divorced    Spouse name: Not on file  . Number of children: Not on file  . Years of education: Not on file  . Highest education level: Not on file  Occupational History  . Not on file  Social Needs  . Financial resource strain: Not on file  . Food insecurity:    Worry: Not on file    Inability: Not on file  . Transportation needs:    Medical: Not on file    Non-medical: Not on file  Tobacco Use  . Smoking status: Former Smoker    Packs/day: 1.00    Years: 24.00    Pack years: 24.00    Types: Cigarettes    Last attempt to quit: 03/03/2011    Years since quitting: 7.1  . Smokeless tobacco: Never Used  Substance and Sexual Activity  . Alcohol use: Yes    Alcohol/week: 0.0 oz    Comment: occasional  . Drug use: No  . Sexual activity: Yes  Lifestyle  . Physical activity:    Days per week: Not on file    Minutes per session: Not on file  . Stress: Not on file  Relationships  . Social connections:    Talks on phone: Not  on file    Gets together: Not on file    Attends religious service: Not on file    Active member of club or organization: Not on file    Attends meetings of clubs or organizations: Not on file    Relationship status: Not on file  Other Topics Concern  . Not on file  Social History Narrative   ** Merged History Encounter **        Past Medical History:  Diagnosis Date  . ADD (attention deficit disorder) without hyperactivity   . Anxiety   . Coronary artery disease   . Diabetes mellitus without complication (HCC)   . Hyperlipidemia   . Hypertension      Patient Active Problem List   Diagnosis Date Noted  . Low back strain 05/11/2015  .  ASCUS favoring benign 04/28/2015  . Diabetes mellitus with no complication (HCC) 04/28/2015  . Hypertriglyceridemia 04/28/2015  . Chronic pain 04/28/2015  . Nonspecific abnormal finding 03/10/2015  . Cervical high risk HPV (human papillomavirus) test positive 03/10/2015  . Blood in the urine 03/10/2015  . Knee pain 03/10/2015  . Arteriosclerosis of coronary artery 11/20/2012  . Fam hx-ischem heart disease 01/08/2009  . Anxiety disorder 12/31/2008  . ADD (attention deficit hyperactivity disorder, inattentive type) 12/31/2008  . Essential (primary) hypertension 12/31/2008  . Anxiety disorder 12/31/2008    History reviewed. No pertinent surgical history.  Family History        Family Status  Relation Name Status  . Mother  Alive  . Father  Alive  . Emelda Brothers  Deceased at age 53       cancer  . Sister  Alive  . Brother  Alive  . MGM  Deceased at age 19       MI  . MGF  Deceased at age 97       cause of death: old age  . PGF  Deceased at age 66        Her family history includes Breast cancer in her paternal aunt; COPD in her father; Cancer in her paternal aunt; Cancer (age of onset: 81) in her paternal grandfather; Chronic bronchitis in her father; Diabetes in her father and sister; Heart attack in her maternal grandmother and paternal grandfather; Heart attack (age of onset: 60) in her father; Hypertension in her father and mother; Prostate cancer in her paternal grandfather.      No Known Allergies   Current Outpatient Medications:  .  aspirin 81 MG tablet, Take 81 mg by mouth daily., Disp: , Rfl:  .  glipiZIDE (GLUCOTROL) 5 MG tablet, TAKE 1 TABLET (5 MG TOTAL) BY MOUTH 2 (TWO) TIMES DAILY BEFORE A MEAL., Disp: 30 tablet, Rfl: 0 .  hydrochlorothiazide (HYDRODIURIL) 25 MG tablet, HYDROCHLOROTHIAZIDE, 25MG  (Oral Tablet)  1 every day for 0 days  Quantity: 0.00;  Refills: 0   Ordered :31-Jan-2012  Janey Greaser ;  Started 31-Dec-2008 Active, Disp: , Rfl:  .  losartan (COZAAR) 50  MG tablet, Take 1 tablet by mouth daily. , Disp: , Rfl:  .  metFORMIN (GLUCOPHAGE) 1000 MG tablet, Take 1 tablet (1,000 mg total) by mouth 2 (two) times daily with a meal., Disp: 180 tablet, Rfl: 3 .  metoprolol (LOPRESSOR) 100 MG tablet, Take 1 tablet by mouth 2 (two) times daily., Disp: , Rfl:  .  PROAIR HFA 108 (90 Base) MCG/ACT inhaler, , Disp: , Rfl:  .  simvastatin (ZOCOR) 20 MG tablet, simvastatin 20 mg tablet, Disp: ,  Rfl:  .  clonazePAM (KLONOPIN) 1 MG tablet, TAKE 1 TABLET BY MOUTH TWICE A DAY AS NEEDED FOR ANXIETY (Patient not taking: Reported on 05/01/2018), Disp: 30 tablet, Rfl: 0   Patient Care Team: Malva LimesFisher, Donald E, MD as PCP - General (Family Medicine) Sherrie MustacheFisher, Demetrios Isaacsonald E, MD (Family Medicine)      Objective:   Vitals: BP 140/88 (BP Location: Left Arm, Patient Position: Sitting, Cuff Size: Normal)   Pulse 94   Temp 98.1 F (36.7 C) (Oral)   Resp 16   Wt 188 lb (85.3 kg)   LMP 04/22/2018   SpO2 98%   BMI 34.39 kg/m    Vitals:   05/01/18 1410  BP: 140/88  Pulse: 94  Resp: 16  Temp: 98.1 F (36.7 C)  TempSrc: Oral  SpO2: 98%  Weight: 188 lb (85.3 kg)     Physical Exam  Constitutional: She is oriented to person, place, and time. She appears well-developed and well-nourished. No distress.  HENT:  Head: Normocephalic and atraumatic.  Right Ear: Hearing, tympanic membrane, external ear and ear canal normal.  Left Ear: Hearing, tympanic membrane, external ear and ear canal normal.  Nose: Nose normal.  Mouth/Throat: Uvula is midline, oropharynx is clear and moist and mucous membranes are normal. No oropharyngeal exudate.  Eyes: Pupils are equal, round, and reactive to light. Conjunctivae and EOM are normal. Right eye exhibits no discharge. Left eye exhibits no discharge. No scleral icterus.  Neck: Normal range of motion. Neck supple. No JVD present. Carotid bruit is not present. No tracheal deviation present. No thyromegaly present.  Cardiovascular: Normal rate,  regular rhythm, normal heart sounds and intact distal pulses. Exam reveals no gallop and no friction rub.  No murmur heard. Pulmonary/Chest: Effort normal and breath sounds normal. No respiratory distress. She has no wheezes. She has no rales. She exhibits no tenderness. Right breast exhibits no inverted nipple, no mass, no nipple discharge, no skin change and no tenderness. Left breast exhibits no inverted nipple, no mass, no nipple discharge, no skin change and no tenderness. No breast tenderness, discharge or bleeding. Breasts are symmetrical.  Abdominal: Soft. Bowel sounds are normal. She exhibits no distension and no mass. There is no tenderness. There is no rebound and no guarding. Hernia confirmed negative in the right inguinal area and confirmed negative in the left inguinal area.  Genitourinary: Rectum normal, vagina normal and uterus normal. No breast tenderness, discharge or bleeding. Pelvic exam was performed with patient supine. There is no rash, tenderness, lesion or injury on the right labia. There is no rash, tenderness, lesion or injury on the left labia. Cervix exhibits no motion tenderness, no discharge and no friability. Right adnexum displays no mass, no tenderness and no fullness. Left adnexum displays no mass, no tenderness and no fullness. No erythema, tenderness or bleeding in the vagina. No signs of injury around the vagina. No vaginal discharge found.  Musculoskeletal: Normal range of motion. She exhibits no edema or tenderness.  Lymphadenopathy:    She has no cervical adenopathy.       Right: No inguinal adenopathy present.       Left: No inguinal adenopathy present.  Neurological: She is alert and oriented to person, place, and time. She has normal reflexes. No cranial nerve deficit. Coordination normal.  Skin: Skin is warm and dry. No rash noted. She is not diaphoretic.  Psychiatric: She has a normal mood and affect. Her behavior is normal. Judgment and thought content  normal.  Vitals  reviewed.    Depression Screen PHQ 2/9 Scores 05/01/2018  PHQ - 2 Score 6  PHQ- 9 Score 18      Assessment & Plan:     Routine Health Maintenance and Physical Exam  Exercise Activities and Dietary recommendations Goals    None      Immunization History  Administered Date(s) Administered  . H1N1 07/30/2008  . Influenza Split 07/30/2008  . Influenza,inj,Quad PF,6+ Mos 07/12/2017  . Pneumococcal Polysaccharide-23 07/12/2017  . Tdap 05/31/2012    Health Maintenance  Topic Date Due  . FOOT EXAM  08/15/1980  . OPHTHALMOLOGY EXAM  08/15/1980  . HIV Screening  08/15/1985  . PAP SMEAR  06/19/2017  . HEMOGLOBIN A1C  01/10/2018  . INFLUENZA VACCINE  05/02/2018  . TETANUS/TDAP  05/31/2022  . PNEUMOCOCCAL POLYSACCHARIDE VACCINE (2) 07/12/2022     Discussed health benefits of physical activity, and encouraged her to engage in regular exercise appropriate for her age and condition.   1. Annual physical exam Normal physical exam today. Will check labs as below and f/u pending lab results. If labs are stable and WNL she will not need to have these rechecked for one year at her next annual physical exam. She is to call the office in the meantime if she has any acute issue, questions or concerns. - CBC with Differential/Platelet - Comprehensive metabolic panel - Lipid panel - TSH - Hemoglobin A1c  2. Breast cancer screening Breast exam today was normal. There is no family history of breast cancer. She does perform regular self breast exams. Mammogram was ordered as below. Information for [facility] clinic was given to patient so she may schedule her mammogram at her convenience. - MM Digital Screening; Future  3. Cervical cancer screening Pap collected today. Will send as below and f/u pending results. - Cytology - PAP  4. Essential (primary) hypertension Followed by Dr. Darrold Junker, cardiology. Continue metoprolol, losartan and HCTZ. Will check labs as  below and f/u pending results. - CBC with Differential/Platelet - Comprehensive metabolic panel - Lipid panel - Hemoglobin A1c  5. Diabetes mellitus with no complication (HCC) Stable. Continue metformin and glipizide. Will check labs as below and f/u pending results. - Comprehensive metabolic panel - Lipid panel - Hemoglobin A1c - glipiZIDE (GLUCOTROL) 5 MG tablet; Take 1 tablet (5 mg total) by mouth 2 (two) times daily before a meal.  Dispense: 180 tablet; Refill: 3 - metFORMIN (GLUCOPHAGE) 1000 MG tablet; Take 1 tablet (1,000 mg total) by mouth 2 (two) times daily with a meal.  Dispense: 180 tablet; Refill: 3  6. Generalized anxiety disorder Previously used venlafaxine and successful. Will restart as below.  - CBC with Differential/Platelet - TSH - venlafaxine XR (EFFEXOR-XR) 75 MG 24 hr capsule; Take 1 capsule (75 mg total) by mouth daily with breakfast.  Dispense: 90 capsule; Refill: 1  7. Cervical high risk HPV (human papillomavirus) test positive H/O this in 2013. Previously seen by Dr. Feliberto Gottron with negative colposcopy. 2015 pap was normal.  - Comprehensive metabolic panel  8. Hypertriglyceridemia Continue simvastatin 20mg . Will check labs as below and f/u pending results. - CBC with Differential/Platelet - Comprehensive metabolic panel - Lipid panel - Hemoglobin A1c  9. Irritable bowel syndrome with diarrhea Suspect diarrhea from IBS-D. Has a lot of triggers currently. Will check labs as below and f/u pending results. Will start Bentyl as below to see if this helps lessen symptoms. If not may consider linzess or amitiza.  - CBC with Differential/Platelet - Comprehensive  metabolic panel - TSH - dicyclomine (BENTYL) 10 MG capsule; Take 1 capsule (10 mg total) by mouth 4 (four) times daily -  before meals and at bedtime.  Dispense: 120 capsule; Refill: 1  10. BMI 34.0-34.9,adult Counseled patient on healthy lifestyle modifications including dieting and exercise.      --------------------------------------------------------------------    Margaretann Loveless, PA-C  [facility]

## 2018-05-02 ENCOUNTER — Telehealth: Payer: Self-pay

## 2018-05-02 LAB — CBC WITH DIFFERENTIAL/PLATELET
BASOS ABS: 0.1 10*3/uL (ref 0.0–0.2)
Basos: 0 %
EOS (ABSOLUTE): 0.1 10*3/uL (ref 0.0–0.4)
Eos: 1 %
Hematocrit: 41.6 % (ref 34.0–46.6)
Hemoglobin: 14.4 g/dL (ref 11.1–15.9)
Immature Grans (Abs): 0.1 10*3/uL (ref 0.0–0.1)
Immature Granulocytes: 1 %
LYMPHS: 26 %
Lymphocytes Absolute: 3.6 10*3/uL — ABNORMAL HIGH (ref 0.7–3.1)
MCH: 29.7 pg (ref 26.6–33.0)
MCHC: 34.6 g/dL (ref 31.5–35.7)
MCV: 86 fL (ref 79–97)
MONOS ABS: 0.7 10*3/uL (ref 0.1–0.9)
Monocytes: 5 %
NEUTROS ABS: 9.5 10*3/uL — AB (ref 1.4–7.0)
Neutrophils: 67 %
Platelets: 394 10*3/uL (ref 150–450)
RBC: 4.85 x10E6/uL (ref 3.77–5.28)
RDW: 13.5 % (ref 12.3–15.4)
WBC: 14 10*3/uL — ABNORMAL HIGH (ref 3.4–10.8)

## 2018-05-02 LAB — LIPID PANEL
CHOLESTEROL TOTAL: 180 mg/dL (ref 100–199)
Chol/HDL Ratio: 6.2 ratio — ABNORMAL HIGH (ref 0.0–4.4)
HDL: 29 mg/dL — AB (ref >39)
Triglycerides: 453 mg/dL — ABNORMAL HIGH (ref 0–149)

## 2018-05-02 LAB — COMPREHENSIVE METABOLIC PANEL
A/G RATIO: 1.6 (ref 1.2–2.2)
ALK PHOS: 93 IU/L (ref 39–117)
ALT: 22 IU/L (ref 0–32)
AST: 19 IU/L (ref 0–40)
Albumin: 4.6 g/dL (ref 3.5–5.5)
BILIRUBIN TOTAL: 0.4 mg/dL (ref 0.0–1.2)
BUN/Creatinine Ratio: 14 (ref 9–23)
BUN: 8 mg/dL (ref 6–24)
CHLORIDE: 102 mmol/L (ref 96–106)
CO2: 16 mmol/L — ABNORMAL LOW (ref 20–29)
Calcium: 9.4 mg/dL (ref 8.7–10.2)
Creatinine, Ser: 0.57 mg/dL (ref 0.57–1.00)
GFR calc Af Amer: 128 mL/min/{1.73_m2} (ref >59)
GFR calc non Af Amer: 111 mL/min/{1.73_m2} (ref >59)
GLUCOSE: 149 mg/dL — AB (ref 65–99)
Globulin, Total: 2.8 g/dL (ref 1.5–4.5)
POTASSIUM: 4.3 mmol/L (ref 3.5–5.2)
Sodium: 136 mmol/L (ref 134–144)
Total Protein: 7.4 g/dL (ref 6.0–8.5)

## 2018-05-02 LAB — TSH: TSH: 1.7 u[IU]/mL (ref 0.450–4.500)

## 2018-05-02 LAB — HEMOGLOBIN A1C
Est. average glucose Bld gHb Est-mCnc: 137 mg/dL
HEMOGLOBIN A1C: 6.4 % — AB (ref 4.8–5.6)

## 2018-05-02 NOTE — Telephone Encounter (Signed)
Patient advised as directed below.  Thanks,  -Joseline 

## 2018-05-02 NOTE — Telephone Encounter (Signed)
-----   Message from Margaretann LovelessJennifer M Burnette, [place]PA-C sent at 05/02/2018  1:12 PM EDT ----- Your WBC count is elevated that can be consistent with a viral infection (most likely URI due to your symptoms). Hemoglobin is normal. Would recommend to recheck in 2 weeks to see if returning to normal. Kidney and liver function are normal. Sugar was 149. A1c down to 6.4.  Doing better. Cholesterol is normal. Triglycerides still remain elevated at 453, but improved. Make sure to work on dietary changes and limit fatty foods. Thyroid is normal.

## 2018-05-06 LAB — CYTOLOGY - PAP
ADEQUACY: ABSENT
Diagnosis: NEGATIVE
HPV: NOT DETECTED

## 2018-05-07 ENCOUNTER — Telehealth: Payer: Self-pay

## 2018-05-07 NOTE — Telephone Encounter (Signed)
Viewed by Wyvonnia Lorahantel G Lineberry on 05/07/2018 1:33 PM

## 2018-05-07 NOTE — Telephone Encounter (Signed)
-----   Message from Jennifer M Burnette, PA-C sent at 05/07/2018  1:11 PM EDT ----- Pap is normal, HPV negative.  Will repeat in 3-5 years. 

## 2018-05-17 ENCOUNTER — Ambulatory Visit: Payer: Self-pay | Admitting: Family Medicine

## 2018-06-28 ENCOUNTER — Ambulatory Visit: Payer: Medicaid Other | Admitting: Family Medicine

## 2018-06-28 ENCOUNTER — Encounter: Payer: Self-pay | Admitting: Family Medicine

## 2018-06-28 VITALS — BP 151/93 | HR 85 | Temp 99.6°F | Resp 18 | Wt 186.0 lb

## 2018-06-28 DIAGNOSIS — M545 Low back pain, unspecified: Secondary | ICD-10-CM

## 2018-06-28 DIAGNOSIS — M519 Unspecified thoracic, thoracolumbar and lumbosacral intervertebral disc disorder: Secondary | ICD-10-CM | POA: Diagnosis not present

## 2018-06-28 MED ORDER — CYCLOBENZAPRINE HCL 5 MG PO TABS: 5.0000 mg | ORAL_TABLET | Freq: Three times a day (TID) | ORAL | 1 refills | Status: DC | PRN

## 2018-06-28 MED ORDER — NAPROXEN 500 MG PO TABS: 500.0000 mg | ORAL_TABLET | Freq: Two times a day (BID) | ORAL | 0 refills | Status: DC

## 2018-06-28 NOTE — Patient Instructions (Signed)
   Apply ice pack to low back for 10 minutes three times a day   Call for prednisone prescription if not much better over the weekend.

## 2018-06-28 NOTE — Progress Notes (Signed)
Patient: Gabrielle Owens Female    DOB: 03-12-1971   47 y.o.   MRN: [id number] Visit Date: 06/28/2018  Today's Provider: Mila Merry, MD   Chief Complaint  Patient presents with  . Back Pain   Subjective:    Back Pain  This is a chronic problem. The current episode started yesterday (flare up started after doing some house cleaning ). The problem has been gradually worsening since onset. The pain is present in the lumbar spine. The quality of the pain is described as aching (pain changes to a sharp pain when she stands up straight). The symptoms are aggravated by standing and twisting. Pertinent negatives include no abdominal pain, chest pain, fever or weakness.  She has a long history of back pain having had MRI in 2009 revealing L4-L5 disc desiccation and broad based disc herniation which she states put her out of work for several months. 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 3 mm grade 1 retrolisthesis at L4-5. She was treated with ibuprofen and tramadol. She states with current flare she has been taking up to 4 ibuprofen every 4-6 hours with minimal relief. She has been having spasms shooting through her lower back which is worse in the morning. Pain radiates around her sides, but not into her legs. No leg weakness or loss of balance.      No Known Allergies   Current Outpatient Medications:  .  aspirin 81 MG tablet, Take 81 mg by mouth daily., Disp: , Rfl:  .  glipiZIDE (GLUCOTROL) 5 MG tablet, Take 1 tablet (5 mg total) by mouth 2 (two) times daily before a meal., Disp: 180 tablet, Rfl: 3 .  hydrochlorothiazide (HYDRODIURIL) 25 MG tablet, HYDROCHLOROTHIAZIDE, 25MG  (Oral Tablet)  1 every day for 0 days  Quantity: 0.00;  Refills: 0   Ordered :31-Jan-2012  Janey Greaser ;  Started 31-Dec-2008 Active, Disp: , Rfl:  .  losartan (COZAAR) 50 MG tablet, Take 1 tablet by mouth daily. , Disp: , Rfl:  .   metFORMIN (GLUCOPHAGE) 1000 MG tablet, Take 1 tablet (1,000 mg total) by mouth 2 (two) times daily with a meal., Disp: 180 tablet, Rfl: 3 .  metoprolol (LOPRESSOR) 100 MG tablet, Take 1 tablet by mouth 2 (two) times daily., Disp: , Rfl:  .  PROAIR HFA 108 (90 Base) MCG/ACT inhaler, , Disp: , Rfl:  .  simvastatin (ZOCOR) 20 MG tablet, simvastatin 20 mg tablet, Disp: , Rfl:  .  venlafaxine XR (EFFEXOR-XR) 75 MG 24 hr capsule, Take 1 capsule (75 mg total) by mouth daily with breakfast., Disp: 90 capsule, Rfl: 1  Review of Systems  Constitutional: Negative for appetite change, chills, fatigue and fever.  Respiratory: Negative for chest tightness and shortness of breath.   Cardiovascular: Negative for chest pain and palpitations.  Gastrointestinal: Negative for abdominal pain, nausea and vomiting.  Genitourinary:       Dark colored urine  Musculoskeletal: Positive for back pain.  Neurological: Negative for dizziness and weakness.    Social History   Tobacco Use  . Smoking status: Former Smoker    Packs/day: 1.00    Years: 24.00    Pack years: 24.00    Types: Cigarettes    Last attempt to quit: 03/03/2011    Years since quitting: 7.3  . Smokeless tobacco: Never Used  Substance Use Topics  . Alcohol use: Yes    Alcohol/week: 0.0  standard drinks    Comment: occasional   Objective:   BP (!) 151/93 (BP Location: Left Arm, Patient Position: Sitting, Cuff Size: Large)   Pulse 85   Temp 99.6 F (37.6 C) (Oral)   Resp 18   Wt 186 lb (84.4 kg)   SpO2 99% Comment: room air  BMI 34.02 kg/m  Vitals:   06/28/18 1517  BP: (!) 151/93  Pulse: 85  Resp: 18  Temp: 99.6 F (37.6 C)  TempSrc: Oral  SpO2: 99%  Weight: 186 lb (84.4 kg)     Physical Exam  General appearance: alert, well developed, well nourished, cooperative and in no distress Head: Normocephalic, without obvious abnormality, atraumatic Respiratory: Respirations even and unlabored, normal respiratory rate Extremities:  No gross deformities MS: Tender lower lumbar spine and bilateral para lumbar muscles. Normal DTR. +5 muscle strength     Assessment & Plan:     1. Acute midline low back pain without sciatica  - cyclobenzaprine (FLEXERIL) 5 MG tablet; Take 1-2 tablets (5-10 mg total) by mouth 3 (three) times daily as needed for muscle spasms.  Dispense: 30 tablet; Refill: 1 - naproxen (NAPROSYN) 500 MG tablet; Take 1 tablet (500 mg total) by mouth 2 (two) times daily with a meal.  Dispense: 30 tablet; Refill: 0  Recommend regular ice packs to back. Consider prednisone if not much better over the weekend.   2. Lumbar disc disease        Mila Merry, MD  [facility]

## 2018-06-29 DIAGNOSIS — M519 Unspecified thoracic, thoracolumbar and lumbosacral intervertebral disc disorder: Secondary | ICD-10-CM | POA: Insufficient documentation

## 2018-07-01 ENCOUNTER — Telehealth: Payer: Self-pay | Admitting: Family Medicine

## 2018-07-01 MED ORDER — PREDNISONE 10 MG PO TABS: ORAL_TABLET | ORAL | 0 refills | Status: AC

## 2018-07-01 NOTE — Telephone Encounter (Signed)
Please advise 

## 2018-07-01 NOTE — Telephone Encounter (Signed)
Was in last week for back pain and was told if she were no better over the weekend that you would prescribe a round of prednisone.  She is no better.  She uses [facility]  CB#  [phone]  Thanks Barth Kirks

## 2018-08-05 ENCOUNTER — Ambulatory Visit (INDEPENDENT_AMBULATORY_CARE_PROVIDER_SITE_OTHER): Payer: Medicaid Other | Admitting: Family Medicine

## 2018-08-05 DIAGNOSIS — Z23 Encounter for immunization: Secondary | ICD-10-CM

## 2018-09-04 ENCOUNTER — Ambulatory Visit: Payer: Self-pay | Admitting: Physician Assistant

## 2018-09-04 NOTE — Progress Notes (Deleted)
Established Patient Office Visit  Subjective:  Patient ID: Gabrielle Owens, female    DOB: 1971-05-28  Age: 47 y.o. MRN: [id number]  CC: No chief complaint on file.   HPI Gabrielle Owens presents today C/O menopausal symptoms  Past Medical History:  Diagnosis Date  . ADD (attention deficit disorder) without hyperactivity   . Anxiety   . Coronary artery disease   . Diabetes mellitus without complication (HCC)   . Hyperlipidemia   . Hypertension     No past surgical history on file.  Family History  Problem Relation Age of Onset  . Hypertension Mother   . Heart attack Father 30       mid 48's  . Diabetes Father        type 2  . Hypertension Father   . COPD Father   . Chronic bronchitis Father   . Breast cancer Paternal Aunt   . Cancer Paternal Aunt        breast  . Diabetes Sister   . Heart attack Maternal Grandmother   . Prostate cancer Paternal Grandfather   . Cancer Paternal Grandfather 32       prostate  . Heart attack Paternal Grandfather     Social History   Socioeconomic History  . Marital status: Divorced    Spouse name: Not on file  . Number of children: Not on file  . Years of education: Not on file  . Highest education level: Not on file  Occupational History  . Not on file  Social Needs  . Financial resource strain: Not on file  . Food insecurity:    Worry: Not on file    Inability: Not on file  . Transportation needs:    Medical: Not on file    Non-medical: Not on file  Tobacco Use  . Smoking status: Former Smoker    Packs/day: 1.00    Years: 24.00    Pack years: 24.00    Types: Cigarettes    Last attempt to quit: 03/03/2011    Years since quitting: 7.5  . Smokeless tobacco: Never Used  Substance and Sexual Activity  . Alcohol use: Yes    Alcohol/week: 0.0 standard drinks    Comment: occasional  . Drug use: No  . Sexual activity: Yes  Lifestyle  . Physical activity:    Days per week: Not on file    Minutes per  session: Not on file  . Stress: Not on file  Relationships  . Social connections:    Talks on phone: Not on file    Gets together: Not on file    Attends religious service: Not on file    Active member of club or organization: Not on file    Attends meetings of clubs or organizations: Not on file    Relationship status: Not on file  . Intimate partner violence:    Fear of current or ex partner: Not on file    Emotionally abused: Not on file    Physically abused: Not on file    Forced sexual activity: Not on file  Other Topics Concern  . Not on file  Social History Narrative   ** Merged History Encounter **        Outpatient Medications Prior to Visit  Medication Sig Dispense Refill  . aspirin 81 MG tablet Take 81 mg by mouth daily.    . cyclobenzaprine (FLEXERIL) 5 MG tablet Take 1-2 tablets (5-10 mg total) by mouth 3 (three) times  daily as needed for muscle spasms. 30 tablet 1  . glipiZIDE (GLUCOTROL) 5 MG tablet Take 1 tablet (5 mg total) by mouth 2 (two) times daily before a meal. 180 tablet 3  . hydrochlorothiazide (HYDRODIURIL) 25 MG tablet HYDROCHLOROTHIAZIDE, 25MG  (Oral Tablet)  1 every day for 0 days  Quantity: 0.00;  Refills: 0   Ordered :31-Jan-2012  Janey GreaserDeSanto, [name] ;  Started 31-Dec-2008 Active    . losartan (COZAAR) 50 MG tablet Take 1 tablet by mouth daily.     . metFORMIN (GLUCOPHAGE) 1000 MG tablet Take 1 tablet (1,000 mg total) by mouth 2 (two) times daily with a meal. 180 tablet 3  . metoprolol (LOPRESSOR) 100 MG tablet Take 1 tablet by mouth 2 (two) times daily.    . naproxen (NAPROSYN) 500 MG tablet Take 1 tablet (500 mg total) by mouth 2 (two) times daily with a meal. 30 tablet 0  . PROAIR HFA 108 (90 Base) MCG/ACT inhaler     . simvastatin (ZOCOR) 20 MG tablet simvastatin 20 mg tablet    . venlafaxine XR (EFFEXOR-XR) 75 MG 24 hr capsule Take 1 capsule (75 mg total) by mouth daily with breakfast. 90 capsule 1   No facility-administered medications prior to  visit.     No Known Allergies  ROS Review of Systems    Objective:    Physical Exam  There were no vitals taken for this visit. Wt Readings from Last 3 Encounters:  06/28/18 186 lb (84.4 kg)  05/01/18 188 lb (85.3 kg)  09/06/17 197 lb (89.4 kg)     Health Maintenance Due  Topic Date Due  . FOOT EXAM  08/15/1980  . OPHTHALMOLOGY EXAM  08/15/1980    There are no preventive care reminders to display for this patient.  Lab Results  Component Value Date   TSH 1.700 05/01/2018   Lab Results  Component Value Date   WBC 14.0 (H) 05/01/2018   HGB 14.4 05/01/2018   HCT 41.6 05/01/2018   MCV 86 05/01/2018   PLT 394 05/01/2018   Lab Results  Component Value Date   NA 136 05/01/2018   K 4.3 05/01/2018   CO2 16 (L) 05/01/2018   GLUCOSE 149 (H) 05/01/2018   BUN 8 05/01/2018   CREATININE 0.57 05/01/2018   BILITOT 0.4 05/01/2018   ALKPHOS 93 05/01/2018   AST 19 05/01/2018   ALT 22 05/01/2018   PROT 7.4 05/01/2018   ALBUMIN 4.6 05/01/2018   CALCIUM 9.4 05/01/2018   ANIONGAP 12 11/21/2012   Lab Results  Component Value Date   CHOL 180 05/01/2018   Lab Results  Component Value Date   HDL 29 (L) 05/01/2018   Lab Results  Component Value Date   LDLCALC Comment 05/01/2018   Lab Results  Component Value Date   TRIG 453 (H) 05/01/2018   Lab Results  Component Value Date   CHOLHDL 6.2 (H) 05/01/2018   Lab Results  Component Value Date   HGBA1C 6.4 (H) 05/01/2018      Assessment & Plan:   Problem List Items Addressed This Visit    None      No orders of the defined types were placed in this encounter.   Follow-up: No follow-ups on file.

## 2018-09-13 ENCOUNTER — Ambulatory Visit: Payer: Medicaid Other | Admitting: Physician Assistant

## 2018-09-13 ENCOUNTER — Encounter: Payer: Self-pay | Admitting: Physician Assistant

## 2018-09-13 VITALS — BP 177/111 | HR 71 | Temp 98.0°F | Resp 16 | Wt 185.0 lb

## 2018-09-13 DIAGNOSIS — N951 Menopausal and female climacteric states: Secondary | ICD-10-CM | POA: Diagnosis not present

## 2018-09-13 MED ORDER — ESCITALOPRAM OXALATE 10 MG PO TABS: 10.0000 mg | ORAL_TABLET | Freq: Every day | ORAL | 3 refills | Status: DC

## 2018-09-13 NOTE — Patient Instructions (Signed)
Escitalopram tablets What is this medicine? ESCITALOPRAM (es sye TAL oh pram) is used to treat depression and certain types of anxiety. This medicine may be used for other purposes; ask your health care provider or pharmacist if you have questions. COMMON BRAND NAME(S): Lexapro What should I tell my health care provider before I take this medicine? They need to know if you have any of these conditions: -bipolar disorder or a family history of bipolar disorder -diabetes -glaucoma -heart disease -kidney or liver disease -receiving electroconvulsive therapy -seizures (convulsions) -suicidal thoughts, plans, or attempt by you or a family member -an unusual or allergic reaction to escitalopram, the related drug citalopram, other medicines, foods, dyes, or preservatives -pregnant or trying to become pregnant -breast-feeding How should I use this medicine? Take this medicine by mouth with a glass of water. Follow the directions on the prescription label. You can take it with or without food. If it upsets your stomach, take it with food. Take your medicine at regular intervals. Do not take it more often than directed. Do not stop taking this medicine suddenly except upon the advice of your doctor. Stopping this medicine too quickly may cause serious side effects or your condition may worsen. A special MedGuide will be given to you by the pharmacist with each prescription and refill. Be sure to read this information carefully each time. Talk to your pediatrician regarding the use of this medicine in children. Special care may be needed. Overdosage: If you think you have taken too much of this medicine contact a poison control center or emergency room at once. NOTE: This medicine is only for you. Do not share this medicine with others. What if I miss a dose? If you miss a dose, take it as soon as you can. If it is almost time for your next dose, take only that dose. Do not take double or extra  doses. What may interact with this medicine? Do not take this medicine with any of the following medications: -certain medicines for fungal infections like fluconazole, itraconazole, ketoconazole, posaconazole, voriconazole -cisapride -citalopram -dofetilide -dronedarone -linezolid -MAOIs like Carbex, Eldepryl, Marplan, Nardil, and Parnate -methylene blue (injected into a vein) -pimozide -thioridazine -ziprasidone This medicine may also interact with the following medications: -alcohol -amphetamines -aspirin and aspirin-like medicines -carbamazepine -certain medicines for depression, anxiety, or psychotic disturbances -certain medicines for migraine headache like almotriptan, eletriptan, frovatriptan, naratriptan, rizatriptan, sumatriptan, zolmitriptan -certain medicines for sleep -certain medicines that treat or prevent blood clots like warfarin, enoxaparin, dalteparin -cimetidine -diuretics -fentanyl -furazolidone -isoniazid -lithium -metoprolol -NSAIDs, medicines for pain and inflammation, like ibuprofen or naproxen -other medicines that prolong the QT interval (cause an abnormal heart rhythm) -procarbazine -rasagiline -supplements like St. John's wort, kava kava, valerian -tramadol -tryptophan This list may not describe all possible interactions. Give your health care provider a list of all the medicines, herbs, non-prescription drugs, or dietary supplements you use. Also tell them if you smoke, drink alcohol, or use illegal drugs. Some items may interact with your medicine. What should I watch for while using this medicine? Tell your doctor if your symptoms do not get better or if they get worse. Visit your doctor or health care professional for regular checks on your progress. Because it may take several weeks to see the full effects of this medicine, it is important to continue your treatment as prescribed by your doctor. Patients and their families should watch out for  new or worsening thoughts of suicide or depression. Also watch out for   sudden changes in feelings such as feeling anxious, agitated, panicky, irritable, hostile, aggressive, impulsive, severely restless, overly excited and hyperactive, or not being able to sleep. If this happens, especially at the beginning of treatment or after a change in dose, call your health care professional. You may get drowsy or dizzy. Do not drive, use machinery, or do anything that needs mental alertness until you know how this medicine affects you. Do not stand or sit up quickly, especially if you are an older patient. This reduces the risk of dizzy or fainting spells. Alcohol may interfere with the effect of this medicine. Avoid alcoholic drinks. Your mouth may get dry. Chewing sugarless gum or sucking hard candy, and drinking plenty of water may help. Contact your doctor if the problem does not go away or is severe. What side effects may I notice from receiving this medicine? Side effects that you should report to your doctor or health care professional as soon as possible: -allergic reactions like skin rash, itching or hives, swelling of the face, lips, or tongue -anxious -black, tarry stools -changes in vision -confusion -elevated mood, decreased need for sleep, racing thoughts, impulsive behavior -eye pain -fast, irregular heartbeat -feeling faint or lightheaded, falls -feeling agitated, angry, or irritable -hallucination, loss of contact with reality -loss of balance or coordination -loss of memory -painful or prolonged erections -restlessness, pacing, inability to keep still -seizures -stiff muscles -suicidal thoughts or other mood changes -trouble sleeping -unusual bleeding or bruising -unusually weak or tired -vomiting Side effects that usually do not require medical attention (report to your doctor or health care professional if they continue or are bothersome): -changes in appetite -change in sex  drive or performance -headache -increased sweating -indigestion, nausea -tremors This list may not describe all possible side effects. Call your doctor for medical advice about side effects. You may report side effects to FDA at 1-800-FDA-1088. Where should I keep my medicine? Keep out of reach of children. Store at room temperature between 15 and 30 degrees C (59 and 86 degrees F). Throw away any unused medicine after the expiration date. NOTE: This sheet is a summary. It may not cover all possible information. If you have questions about this medicine, talk to your doctor, pharmacist, or health care provider.  2018 Elsevier/Gold Standard (2016-02-21 13:20:23)  

## 2018-09-13 NOTE — Progress Notes (Signed)
Patient: Gabrielle Owens Female    DOB: [birth year]   47 y.o.   MRN: [id number] Visit Date: 09/13/2018  Today's Provider: Margaretann Loveless, PA-C   Chief Complaint  Patient presents with  . Menopause   Subjective:     HPI Patient here today for possible menopause, patient reports not having a period for 2 months. Patient reports that she is going "crazy" and reports that she stopped taking venlafaxine about a month ago due to sexual side effects.    No Known Allergies   Current Outpatient Medications:  .  aspirin 81 MG tablet, Take 81 mg by mouth daily., Disp: , Rfl:  .  hydrochlorothiazide (HYDRODIURIL) 25 MG tablet, HYDROCHLOROTHIAZIDE, 25MG  (Oral Tablet)  1 every day for 0 days  Quantity: 0.00;  Refills: 0   Ordered :31-Jan-2012  Janey Greaser ;  Started 31-Dec-2008 Active, Disp: , Rfl:  .  losartan (COZAAR) 50 MG tablet, Take 1 tablet by mouth daily. , Disp: , Rfl:  .  metFORMIN (GLUCOPHAGE) 1000 MG tablet, Take 1 tablet (1,000 mg total) by mouth 2 (two) times daily with a meal., Disp: 180 tablet, Rfl: 3 .  metoprolol (LOPRESSOR) 100 MG tablet, Take 1 tablet by mouth 2 (two) times daily., Disp: , Rfl:  .  PROAIR HFA 108 (90 Base) MCG/ACT inhaler, , Disp: , Rfl:  .  glipiZIDE (GLUCOTROL) 5 MG tablet, Take 1 tablet (5 mg total) by mouth 2 (two) times daily before a meal. (Patient not taking: Reported on 09/13/2018), Disp: 180 tablet, Rfl: 3 .  naproxen (NAPROSYN) 500 MG tablet, Take 1 tablet (500 mg total) by mouth 2 (two) times daily with a meal. (Patient not taking: Reported on 09/13/2018), Disp: 30 tablet, Rfl: 0 .  simvastatin (ZOCOR) 20 MG tablet, simvastatin 20 mg tablet, Disp: , Rfl:  .  venlafaxine XR (EFFEXOR-XR) 75 MG 24 hr capsule, Take 1 capsule (75 mg total) by mouth daily with breakfast. (Patient not taking: Reported on 09/13/2018), Disp: 90 capsule, Rfl: 1  Review of Systems  Constitutional: Negative.   Respiratory: Negative.   Cardiovascular:  Negative.   Psychiatric/Behavioral: Positive for agitation and decreased concentration. The patient is nervous/anxious.     Social History   Tobacco Use  . Smoking status: Former Smoker    Packs/day: 1.00    Years: 24.00    Pack years: 24.00    Types: Cigarettes    Last attempt to quit: 03/03/2011    Years since quitting: 7.5  . Smokeless tobacco: Never Used  Substance Use Topics  . Alcohol use: Yes    Alcohol/week: 0.0 standard drinks    Comment: occasional      Objective:   BP (!) 177/111 (BP Location: Left Arm, Patient Position: Sitting, Cuff Size: Normal)   Pulse 71   Temp 98 F (36.7 C) (Oral)   Resp 16   Wt 185 lb (83.9 kg)   BMI 33.84 kg/m  Vitals:   09/13/18 1514  BP: (!) 177/111  Pulse: 71  Resp: 16  Temp: 98 F (36.7 C)  TempSrc: Oral  Weight: 185 lb (83.9 kg)     Physical Exam Vitals signs reviewed.  Constitutional:      General: She is not in acute distress.    Appearance: She is well-developed. She is not diaphoretic.  Neck:     Musculoskeletal: Normal range of motion and neck supple.     Thyroid: No thyromegaly.     Vascular: No JVD.  Trachea: No tracheal deviation.  Cardiovascular:     Rate and Rhythm: Normal rate and regular rhythm.     Heart sounds: Normal heart sounds. No murmur. No friction rub. No gallop.   Pulmonary:     Effort: Pulmonary effort is normal. No respiratory distress.     Breath sounds: Normal breath sounds. No wheezing or rales.  Lymphadenopathy:     Cervical: No cervical adenopathy.  Psychiatric:        Attention and Perception: Attention normal.        Mood and Affect: Mood is anxious and depressed.        Speech: Speech normal.        Behavior: Behavior normal.        Thought Content: Thought content normal.        Cognition and Memory: Cognition and memory normal.        Judgment: Judgment normal.        Assessment & Plan    1. Menopausal and female climacteric states Mood swings and hot flashes  secondary to perimenopause. Will start lexapro as below. I will see her back in 4 weeks to recheck symptoms and see if she is tolerating well.  - escitalopram (LEXAPRO) 10 MG tablet; Take 1 tablet (10 mg total) by mouth at bedtime. Start with 1/2 tab (5mg ) x 1 week  Dispense: 30 tablet; Refill: 3     Margaretann LovelessJennifer M Alecea Trego, PA-C  [facility]

## 2018-09-17 ENCOUNTER — Other Ambulatory Visit
Admission: RE | Admit: 2018-09-17 | Discharge: 2018-09-17 | Disposition: A | Discharge status: Home / Self Care | Payer: Medicaid Other | Source: Ambulatory Visit | Attending: Physician Assistant | Admitting: Physician Assistant

## 2018-09-17 DIAGNOSIS — R0789 Other chest pain: Secondary | ICD-10-CM | POA: Diagnosis present

## 2018-09-17 LAB — TROPONIN I: Troponin I: <0.03 ng/mL (ref <0.03)

## 2018-09-18 ENCOUNTER — Other Ambulatory Visit: Payer: Self-pay | Admitting: Physician Assistant

## 2018-09-18 DIAGNOSIS — E7800 Pure hypercholesterolemia, unspecified: Secondary | ICD-10-CM

## 2018-09-18 DIAGNOSIS — I25119 Atherosclerotic heart disease of native coronary artery with unspecified angina pectoris: Secondary | ICD-10-CM

## 2018-09-20 ENCOUNTER — Other Ambulatory Visit
Admission: RE | Admit: 2018-09-20 | Discharge: 2018-09-20 | Disposition: A | Discharge status: Home / Self Care | Payer: Medicaid Other | Source: Ambulatory Visit | Attending: Physician Assistant | Admitting: Physician Assistant

## 2018-09-20 ENCOUNTER — Ambulatory Visit
Admission: RE | Admit: 2018-09-20 | Discharge: 2018-09-20 | Disposition: A | Discharge status: Home / Self Care | Payer: Medicaid Other | Source: Ambulatory Visit | Attending: Physician Assistant | Admitting: Physician Assistant

## 2018-09-20 DIAGNOSIS — I25119 Atherosclerotic heart disease of native coronary artery with unspecified angina pectoris: Secondary | ICD-10-CM | POA: Diagnosis present

## 2018-09-20 DIAGNOSIS — E7800 Pure hypercholesterolemia, unspecified: Secondary | ICD-10-CM

## 2018-09-20 LAB — NM MYOCAR MULTI W/SPECT W/WALL MOTION / EF
CSEPED: 10 min
Estimated workload: 10.1 METS
Exercise duration (sec): 27 s
LV dias vol: 25 mL (ref 46–106)
LV sys vol: 8 mL
MPHR: 172 {beats}/min
Peak HR: 139 {beats}/min
Percent HR: 80 %
Rest HR: 70 {beats}/min
SDS: 0
SRS: 0
SSS: 0
TID: 0.48

## 2018-09-20 LAB — HCG, QUANTITATIVE, PREGNANCY: hCG, Beta Chain, Quant, S: <1 m[IU]/mL (ref <5)

## 2018-09-20 MED ORDER — ALBUTEROL SULFATE (2.5 MG/3ML) 0.083% IN NEBU
2.5000 mg | INHALATION_SOLUTION | Freq: Once | RESPIRATORY_TRACT | Status: AC
  Administered 2018-09-20: 2.5 mg via RESPIRATORY_TRACT
  Filled 2018-09-20: qty 3

## 2018-09-20 MED ORDER — TECHNETIUM TC 99M TETROFOSMIN IV KIT
30.3500 | PACK | Freq: Once | INTRAVENOUS | Status: AC | PRN
  Administered 2018-09-20: 30.35 via INTRAVENOUS

## 2018-09-20 MED ORDER — TECHNETIUM TC 99M TETROFOSMIN IV KIT
9.9790 | PACK | Freq: Once | INTRAVENOUS | Status: AC | PRN
  Administered 2018-09-20: 9.979 via INTRAVENOUS

## 2018-12-16 ENCOUNTER — Telehealth: Payer: Self-pay | Admitting: Family Medicine

## 2018-12-16 DIAGNOSIS — N951 Menopausal and female climacteric states: Secondary | ICD-10-CM

## 2018-12-16 MED ORDER — ESCITALOPRAM OXALATE 20 MG PO TABS: 20.0000 mg | ORAL_TABLET | Freq: Every day | ORAL | 1 refills | Status: DC

## 2018-12-16 NOTE — Telephone Encounter (Signed)
Patient's daugher committed suicide on March 1 and she is wanting to increase her Lexapro or get on something else to help with the grieving process.  She does not want Xanax or a "quick" fix drug.     Please let patient know when this is done.    She uses [facility] on [address].

## 2018-12-16 NOTE — Telephone Encounter (Signed)
Please review. Thanks!  

## 2018-12-16 NOTE — Telephone Encounter (Signed)
Need to increase lexapro to 20mg  a day, [facility] prescription for [facility]. She can use up her 10mg  tablets by taking 2 a day. Schedule follow up 3-4 weeks.

## 2018-12-16 NOTE — Telephone Encounter (Signed)
Unable to reach patient at this time no voicemailbox is set up yet. KW

## 2018-12-18 NOTE — Telephone Encounter (Signed)
Pt advised.  Apt made for 01/17/2019 at 8:40  Thanks,   -Vernona Rieger

## 2019-01-17 ENCOUNTER — Other Ambulatory Visit: Payer: Self-pay

## 2019-01-17 ENCOUNTER — Ambulatory Visit: Payer: Self-pay | Admitting: Family Medicine

## 2019-01-17 ENCOUNTER — Encounter (INDEPENDENT_AMBULATORY_CARE_PROVIDER_SITE_OTHER): Payer: Medicaid Other | Admitting: Family Medicine

## 2019-01-17 NOTE — Progress Notes (Signed)
Patient no-showed virtual visit  Mila Merry, MD  [facility]

## 2019-01-17 NOTE — Patient Instructions (Signed)
.   Please review the attached list of medications and notify my office if there are any errors.   . Please bring all of your medications to every appointment so we can make sure that our medication list is the same as yours.   

## 2019-02-11 ENCOUNTER — Ambulatory Visit: Payer: Medicaid Other | Admitting: Physician Assistant

## 2019-02-11 ENCOUNTER — Other Ambulatory Visit: Payer: Self-pay

## 2019-02-11 ENCOUNTER — Encounter: Payer: Self-pay | Admitting: Physician Assistant

## 2019-02-11 VITALS — BP 143/88 | HR 69 | Temp 98.3°F | Resp 16 | Wt 198.6 lb

## 2019-02-11 DIAGNOSIS — F4321 Adjustment disorder with depressed mood: Secondary | ICD-10-CM

## 2019-02-11 DIAGNOSIS — R21 Rash and other nonspecific skin eruption: Secondary | ICD-10-CM

## 2019-02-11 MED ORDER — TRIAMCINOLONE ACETONIDE 0.1 % EX CREA: 1.0000 "application " | TOPICAL_CREAM | Freq: Two times a day (BID) | CUTANEOUS | 0 refills | Status: DC

## 2019-02-11 NOTE — Patient Instructions (Addendum)
Address: [address] [place], [place], [place] [address]  Hours:  Open ? Closes 5PM  Phone: [phone]      Poison Oak Dermatitis  Poison oak dermatitis is redness and soreness (inflammation) of the skin. It is caused by a chemical that is on the leaves of the poison oak plant. You may also have itching, a rash, and blisters. Symptoms often clear up in 1-2 weeks. You may get this condition by touching a poison oak plant. You can also get it by touching something that has the chemical on it. This may include animals or objects that have come in contact with the plant. Follow these instructions at home: General instructions  Take or apply over-the-counter and prescription medicines only as told by your doctor.  If you touch poison oak, wash your skin with soap and cold water right away.  Use hydrocortisone creams or calamine lotion as needed to help with itching.  Take oatmeal baths as needed. Use colloidal oatmeal. You can get this at a pharmacy or grocery store. Follow the instructions on the package.  Do not scratch or rub your skin.  While you have the rash, wash your clothes right after you wear them. Prevention  Know what poison oak looks like so you can avoid it. This plant has three leaves with flowering branches on a single stem. The leaves are fuzzy. They have a toothlike edge.  If you have touched poison oak, wash with soap and water right away. Be sure to wash under your fingernails.  When hiking or camping, wear long pants, a long-sleeved shirt, tall socks, and hiking boots. You can also use a lotion on your skin that helps to prevent contact with the chemical on the plant.  If you think that your clothes or outdoor gear came in contact with poison oak, rinse them off with a garden hose before you bring them inside your house. Contact a doctor if:  You have open sores in the rash area.  You have more redness, swelling, or pain in the affected area.  You have redness  that spreads beyond the rash area.  You have fluid, blood, or pus coming from the affected area.  You have a fever.  You have a rash over a large area of your body.  You have a rash on your eyes, mouth, or genitals.  Your rash does not improve after a few days. Get help right away if:  Your face swells or your eyes swell shut.  You have trouble breathing.  You have trouble swallowing. This information is not intended to replace advice given to you by your health care provider. Make sure you discuss any questions you have with your health care provider. Document Released: 10/21/2010 Document Revised: 06/12/2018 Document Reviewed: 02/24/2015 Elsevier Interactive Patient Education  2019 [employer].

## 2019-02-11 NOTE — Progress Notes (Signed)
Patient: Gabrielle Owens Female    DOB: 08/18/1971   48 y.o.   MRN: [id number] Visit Date: 02/11/2019  Today's Provider: Trey Sailors, PA-C   Chief Complaint  Patient presents with  . Rash   Subjective:    Reports she was working in the yard last week. She had a rash on the left side of her nose that was itching. She took a leftover prednisone pack and this helped the swelling and itching. She had shingles previously and reports this does not feel the same. She has a swollen lymph node on the left side of her neck. She is worried this might be lymphoma.   She reports her oldest child committed suicide on March 1 and she has been dealing with this ever since then. She has increased her Lexapro to 20 mg. She is in contact with [facility] for grief counseling. She noticed her sweating has gotten worse since starting Lexapro.   Rash  This is a new problem. The current episode started in the past 7 days (last wednesday the rash started on her nose.). The problem has been gradually improving since onset. The affected locations include the face. The rash is characterized by blistering, itchiness and redness. She was exposed to plant contact. Associated symptoms include congestion. Pertinent negatives include no fever or shortness of breath. (She also have notice knots/lymphnode on her left side near her ear) Treatments tried: Left over Prednisone. The treatment provided mild relief.    No Known Allergies   Current Outpatient Medications:  .  aspirin 81 MG tablet, Take 81 mg by mouth daily., Disp: , Rfl:  .  escitalopram (LEXAPRO) 20 MG tablet, Take 1 tablet (20 mg total) by mouth at bedtime., Disp: 30 tablet, Rfl: 1 .  hydrochlorothiazide (HYDRODIURIL) 25 MG tablet, HYDROCHLOROTHIAZIDE, 25MG  (Oral Tablet)  1 every day for 0 days  Quantity: 0.00;  Refills: 0   Ordered :31-Jan-2012  Janey Greaser ;  Started 31-Dec-2008 Active, Disp: , Rfl:  .  losartan (COZAAR) 100 MG tablet,  Take 100 mg by mouth daily., Disp: , Rfl:  .  metFORMIN (GLUCOPHAGE) 1000 MG tablet, Take 1 tablet (1,000 mg total) by mouth 2 (two) times daily with a meal., Disp: 180 tablet, Rfl: 3 .  metoprolol (LOPRESSOR) 100 MG tablet, Take 1 tablet by mouth 2 (two) times daily., Disp: , Rfl:  .  PROAIR HFA 108 (90 Base) MCG/ACT inhaler, , Disp: , Rfl:  .  simvastatin (ZOCOR) 20 MG tablet, simvastatin 20 mg tablet, Disp: , Rfl:  .  glipiZIDE (GLUCOTROL) 5 MG tablet, Take 1 tablet (5 mg total) by mouth 2 (two) times daily before a meal. (Patient not taking: Reported on 09/13/2018), Disp: 180 tablet, Rfl: 3  Review of Systems  Constitutional: Negative for fever.  HENT: Positive for congestion.   Eyes: Negative for visual disturbance.  Respiratory: Negative for chest tightness, shortness of breath and wheezing.   Cardiovascular: Negative for chest pain, palpitations and leg swelling.  Skin: Positive for rash.    Social History   Tobacco Use  . Smoking status: Former Smoker    Packs/day: 1.00    Years: 24.00    Pack years: 24.00    Types: Cigarettes    Last attempt to quit: 03/03/2011    Years since quitting: 7.9  . Smokeless tobacco: Never Used  Substance Use Topics  . Alcohol use: Yes    Alcohol/week: 0.0 standard drinks    Comment:  occasional      Objective:   BP (!) 143/88 (BP Location: Left Arm, Patient Position: Sitting, Cuff Size: Large)   Pulse 69   Temp 98.3 F (36.8 C) (Oral)   Resp 16   Wt 198 lb 9.6 oz (90.1 kg)   BMI 36.32 kg/m  Vitals:   02/11/19 0817  BP: (!) 143/88  Pulse: 69  Resp: 16  Temp: 98.3 F (36.8 C)  TempSrc: Oral  Weight: 198 lb 9.6 oz (90.1 kg)     Physical Exam Constitutional:      Appearance: Normal appearance.  HENT:     Head:   Neck:     Comments: Preauricular lymph node slightly enlarged on left side.  Lymphadenopathy:     Cervical: Cervical adenopathy present.     Right cervical: No superficial cervical adenopathy.    Left cervical:  Superficial cervical adenopathy present.  Skin:    General: Skin is warm and dry.     Findings: Rash present.  Neurological:     Mental Status: She is alert.  Psychiatric:        Mood and Affect: Mood normal.        Behavior: Behavior normal.         Assessment & Plan    1. Facial rash  Appears to be healing, will do local steroid cream.   - triamcinolone cream (KENALOG) 0.1 %; Apply 1 application topically 2 (two) times daily.  Dispense: 30 g; Refill: 0  2. Grief  Have provided information for [facility].   The entirety of the information documented in the History of Present Illness, Review of Systems and Physical Exam were personally obtained by me. Portions of this information were initially documented by Hetty ElyJoseline Rosas, CMA and reviewed by me for thoroughness and accuracy.   F/u Prn.     Trey SailorsAdriana M Dontrae Morini, PA-C  [facility]

## 2019-05-13 ENCOUNTER — Other Ambulatory Visit: Payer: Self-pay | Admitting: Physician Assistant

## 2019-05-13 DIAGNOSIS — E119 Type 2 diabetes mellitus without complications: Secondary | ICD-10-CM

## 2019-08-20 ENCOUNTER — Telehealth: Payer: Self-pay | Admitting: Family Medicine

## 2019-08-20 NOTE — Telephone Encounter (Signed)
[facility] is requesting a new prescription for   metFORMIN (GLUCOPHAGE) 1000 MG tablet  They state that it has not been transferred from [facility] and need a new Rx.

## 2019-08-20 NOTE — Telephone Encounter (Signed)
NA-unable to LM- If patient calls back please schedule patient for an appointment.  Last CPE:05/01/2018  Last office visit:02/11/2019.  Last: A1C was 6.4 05/01/2018 patient need  at least a follow-up appt.

## 2019-08-20 NOTE — Telephone Encounter (Signed)
Patient is due for follow up office visit. Tried calling patient. No answer. I called [facility] and notified pharmacist that patient needs to call our office to schedule follow up appointment.

## 2019-09-01 ENCOUNTER — Ambulatory Visit: Payer: Medicaid Other | Admitting: Family Medicine

## 2019-09-01 ENCOUNTER — Other Ambulatory Visit: Payer: Self-pay | Admitting: Family Medicine

## 2019-09-01 DIAGNOSIS — E119 Type 2 diabetes mellitus without complications: Secondary | ICD-10-CM

## 2019-09-01 NOTE — Telephone Encounter (Signed)
[facility] faxed refill request for the following medications:  metFORMIN (GLUCOPHAGE) 1000 MG tablet  Last Rx: 05/13/2019 LOV: 02/14/2019 with Adriana NOV: today 09/01/2019 with Dr. Caryn Section Please advise. Thanks TNP

## 2019-09-01 NOTE — Progress Notes (Deleted)
Patient: Gabrielle Owens Female    DOB: [birth year]   48 y.o.   MRN: [id number] Visit Date: 09/01/2019  Today's Provider: Mila Merry, MD   No chief complaint on file.  Subjective:     Back Pain Pertinent negatives include no abdominal pain, chest pain, fever or weakness.    Diabetes Mellitus Type II, Follow-up:   Lab Results  Component Value Date   HGBA1C 6.4 (H) 05/01/2018   HGBA1C 7.8 07/12/2017   HGBA1C 9.9 10/07/2015    Last seen for diabetes 1 years ago (seen by Joycelyn Man, PA-C)  Management since then includes no changes. She reports {excellent/good/fair/poor:19665} compliance with treatment. She {ACTION; IS/IS MLY:65035465} having side effects. *** Current symptoms include {Symptoms; diabetes:14075} and have been {Desc; course:15616}. Home blood sugar records: {diabetes glucometry results:16657}  Episodes of hypoglycemia? {yes***/no:17258}   {Current insulin regiment:22600::"***":1} Most Recent Eye Exam: *** Weight trend: {trend:16658} {Prior visit with dietician:20300:::1} {Current exercise:16438:::1} {Current diet habits:16563:::1}  Pertinent Labs:    Component Value Date/Time   CHOL 180 05/01/2018 1510   CHOL 377 (H) 11/20/2012 0800   TRIG 453 (H) 05/01/2018 1510   TRIG 979 (H) 11/20/2012 0800   HDL 29 (L) 05/01/2018 1510   HDL 23 (L) 11/20/2012 0800   LDLCALC Comment 05/01/2018 1510   LDLCALC SEE COMMENT 11/20/2012 0800   CREATININE 0.57 05/01/2018 1510   CREATININE 0.48 (L) 11/21/2012 0458    Wt Readings from Last 3 Encounters:  02/11/19 198 lb 9.6 oz (90.1 kg)  09/13/18 185 lb (83.9 kg)  06/28/18 186 lb (84.4 kg)    ------------------------------------------------------------------------   No Known Allergies   Current Outpatient Medications:  .  aspirin 81 MG tablet, Take 81 mg by mouth daily., Disp: , Rfl:  .  escitalopram (LEXAPRO) 20 MG tablet, Take 1 tablet (20 mg total) by mouth at bedtime., Disp: 30 tablet,  Rfl: 1 .  glipiZIDE (GLUCOTROL) 5 MG tablet, Take 1 tablet (5 mg total) by mouth 2 (two) times daily before a meal. (Patient not taking: Reported on 09/13/2018), Disp: 180 tablet, Rfl: 3 .  hydrochlorothiazide (HYDRODIURIL) 25 MG tablet, HYDROCHLOROTHIAZIDE, 25MG  (Oral Tablet)  1 every day for 0 days  Quantity: 0.00;  Refills: 0   Ordered :31-Jan-2012  06-25-1995 ;  Started 31-Dec-2008 Active, Disp: , Rfl:  .  losartan (COZAAR) 100 MG tablet, Take 100 mg by mouth daily., Disp: , Rfl:  .  metFORMIN (GLUCOPHAGE) 1000 MG tablet, TAKE 1 TABLET (1,000 MG TOTAL) BY MOUTH 2 (TWO) TIMES DAILY WITH A MEAL., Disp: 180 tablet, Rfl: 0 .  metoprolol (LOPRESSOR) 100 MG tablet, Take 1 tablet by mouth 2 (two) times daily., Disp: , Rfl:  .  PROAIR HFA 108 (90 Base) MCG/ACT inhaler, , Disp: , Rfl:  .  simvastatin (ZOCOR) 20 MG tablet, simvastatin 20 mg tablet, Disp: , Rfl:  .  triamcinolone cream (KENALOG) 0.1 %, Apply 1 application topically 2 (two) times daily., Disp: 30 g, Rfl: 0  Review of Systems  Constitutional: Negative for appetite change, chills, fatigue and fever.  Respiratory: Negative for chest tightness and shortness of breath.   Cardiovascular: Negative for chest pain and palpitations.  Gastrointestinal: Negative for abdominal pain, nausea and vomiting.  Musculoskeletal: Positive for back pain.  Neurological: Negative for dizziness and weakness.    Social History   Tobacco Use  . Smoking status: Former Smoker    Packs/day: 1.00    Years: 24.00    Pack years:  24.00    Types: Cigarettes    Quit date: 03/03/2011    Years since quitting: 8.5  . Smokeless tobacco: Never Used  Substance Use Topics  . Alcohol use: Yes    Alcohol/week: 0.0 standard drinks    Comment: occasional      Objective:   There were no vitals taken for this visit. There were no vitals filed for this visit.There is no height or weight on file to calculate BMI.   Physical Exam   No results found for any visits  on 09/01/19.     Assessment & Plan        Lelon Huh, MD  [facility]

## 2019-09-05 ENCOUNTER — Other Ambulatory Visit: Payer: Self-pay | Admitting: Family Medicine

## 2019-09-05 DIAGNOSIS — E119 Type 2 diabetes mellitus without complications: Secondary | ICD-10-CM

## 2019-09-05 MED ORDER — METFORMIN HCL 1000 MG PO TABS: 1000.0000 mg | ORAL_TABLET | Freq: Two times a day (BID) | ORAL | 0 refills | Status: DC

## 2019-09-05 NOTE — Telephone Encounter (Signed)
[facility] faxed refill request for the following medications:  metFORMIN (GLUCOPHAGE) 1000 MG tablet   Last Rx: 05/13/2019 LOV: 02/11/2019 with Adriana Last CPE: 05/01/2018 with Tawanna Sat Pt had an appt scheduled for  09/01/2019 with Dr. Caryn Section but didn't come to the appt. Please advise. Thanks TNP

## 2019-10-14 ENCOUNTER — Other Ambulatory Visit: Payer: Self-pay | Admitting: Physician Assistant

## 2019-10-27 ENCOUNTER — Other Ambulatory Visit: Payer: Self-pay

## 2019-10-27 ENCOUNTER — Ambulatory Visit: Payer: Medicaid Other | Attending: Internal Medicine

## 2019-10-27 DIAGNOSIS — Z20822 Contact with and (suspected) exposure to covid-19: Secondary | ICD-10-CM

## 2019-10-28 ENCOUNTER — Telehealth: Payer: Self-pay

## 2019-10-28 LAB — NOVEL CORONAVIRUS, NAA: SARS-CoV-2, NAA: DETECTED — AB

## 2019-10-28 NOTE — Telephone Encounter (Signed)
Copied from CRM [id number]. Topic: General - Other >> Oct 28, 2019  2:44 PM Laural Benes, [place] C wrote: Reason for CRM: pt called in to make provider aware that she tested positive for covid. Pt says that she feels like she have a sinus infection, so she had been taking amoxicillin. pt says that her body feels kinda weak. Those are the only changes that she's notice, pt would like to know should she stop taking amoxicillin now that she has tested positive?    CB: [phone]

## 2019-10-28 NOTE — Telephone Encounter (Signed)
Patient advised as below. Patient is requesting a work note. Patient reports that she started having symptoms on Saturday 10/25/2019 and received positive test result for COVID-19 today. Please advise.

## 2019-10-28 NOTE — Telephone Encounter (Signed)
She can have note to return to work February 7th

## 2019-10-28 NOTE — Telephone Encounter (Signed)
Yes, the amoxicillin is not going to help. She can stop it.

## 2019-10-28 NOTE — Telephone Encounter (Signed)
Patient reports that she started taking amoxicillin 500mg  tablets. Patient reports taking 1 tablet twice a day. Patient reports that she has been taking medication since Sunday morning. Patient reports fatigue, nasal congestion. Patient reports she is also taking Sudafed. Patient wants to know if she should continue taking amoxicillin and Sudafed. Please advise.

## 2019-10-29 ENCOUNTER — Telehealth: Payer: Self-pay

## 2019-10-29 NOTE — Telephone Encounter (Signed)
Letter sent to patient through Mychart.

## 2019-10-29 NOTE — Telephone Encounter (Signed)
Patient advised that lab results cannot be sent through e-mail. Patient verbalized understanding.

## 2019-10-29 NOTE — Telephone Encounter (Signed)
Copied from CRM [id number]. Topic: General - Other >> Oct 28, 2019  5:14 PM Marylen Ponto wrote: Reason for CRM: Pt stated she was told to call back with an e-mail address where her Covid-19 test results could be sent. Pt requests that her results be e-mailed to [email or web address] attention:Jeff Axel Filler

## 2019-10-30 ENCOUNTER — Other Ambulatory Visit: Payer: Self-pay | Admitting: Infectious Diseases

## 2019-10-30 ENCOUNTER — Telehealth: Payer: Self-pay | Admitting: Infectious Diseases

## 2019-10-30 DIAGNOSIS — Z6836 Body mass index (BMI) 36.0-36.9, adult: Secondary | ICD-10-CM

## 2019-10-30 DIAGNOSIS — E1165 Type 2 diabetes mellitus with hyperglycemia: Secondary | ICD-10-CM

## 2019-10-30 DIAGNOSIS — U071 COVID-19: Secondary | ICD-10-CM

## 2019-10-30 NOTE — Telephone Encounter (Signed)
Called to discuss with patient about Covid symptoms and the use of bamlanivimab, a monoclonal antibody infusion for those with mild to moderate Covid symptoms and at a high risk of hospitalization.  Pt is qualified for this infusion at the [facility] infusion center due to BMI>35, diabetes  She started feeling poorly last week with vomiting on Thursday, malaise, sinus symptoms. She started running a fever Saturday afternoon and developed some diarrhea. She does have a light dry cough but still mostly with URI/head cold symptoms that predominate.   10/31/19 @ 10:30 a.m. scheduled.

## 2019-10-30 NOTE — Progress Notes (Signed)
  I connected by phone with Gabrielle Owens on 10/30/2019 at 8:57 AM to discuss the potential use of an new treatment for mild to moderate COVID-19 viral infection in non-hospitalized patients.  This patient is a 49 y.o. female that meets the FDA criteria for Emergency Use Authorization of bamlanivimab or casirivimab\imdevimab.  Has a (+) direct SARS-CoV-2 viral test result  Has mild or moderate COVID-19   Is ? 50 years of age and weighs ? 40 kg  Is NOT hospitalized due to COVID-19  Is NOT requiring oxygen therapy or requiring an increase in baseline oxygen flow rate due to COVID-19  Is within 10 days of symptom onset  Has at least one of the high risk factor(s) for progression to severe COVID-19 and/or hospitalization as defined in EUA.  Specific high risk criteria : Diabetes, BMI>35   I have spoken and communicated the following to the patient or parent/caregiver:  1. FDA has authorized the emergency use of bamlanivimab and casirivimab\imdevimab for the treatment of mild to moderate COVID-19 in adults and pediatric patients with positive results of direct SARS-CoV-2 viral testing who are 39 years of age and older weighing at least 40 kg, and who are at high risk for progressing to severe COVID-19 and/or hospitalization.  2. The significant known and potential risks and benefits of bamlanivimab and casirivimab\imdevimab, and the extent to which such potential risks and benefits are unknown.  3. Information on available alternative treatments and the risks and benefits of those alternatives, including clinical trials.  4. Patients treated with bamlanivimab and casirivimab\imdevimab should continue to self-isolate and use infection control measures (e.g., wear mask, isolate, social distance, avoid sharing personal items, clean and disinfect "high touch" surfaces, and frequent handwashing) according to CDC guidelines.   5. The patient or parent/caregiver has the option to accept or  refuse bamlanivimab or casirivimab\imdevimab .  After reviewing this information with the patient, The patient agreed to proceed with receiving the casirivimab\imdevimab infusion and will be provided a copy of the Fact sheet prior to receiving the infusion.Gabrielle Owens 10/30/2019 8:57 AM

## 2019-10-31 ENCOUNTER — Ambulatory Visit (HOSPITAL_COMMUNITY)
Admission: RE | Admit: 2019-10-31 | Discharge: 2019-10-31 | Disposition: A | Discharge status: Home / Self Care | Payer: Managed Care, Other (non HMO) | Source: Ambulatory Visit | Attending: Pulmonary Disease | Admitting: Pulmonary Disease

## 2019-10-31 DIAGNOSIS — Z23 Encounter for immunization: Secondary | ICD-10-CM | POA: Insufficient documentation

## 2019-10-31 DIAGNOSIS — U071 COVID-19: Secondary | ICD-10-CM | POA: Insufficient documentation

## 2019-10-31 DIAGNOSIS — Z6836 Body mass index (BMI) 36.0-36.9, adult: Secondary | ICD-10-CM | POA: Diagnosis not present

## 2019-10-31 DIAGNOSIS — E1165 Type 2 diabetes mellitus with hyperglycemia: Secondary | ICD-10-CM | POA: Diagnosis not present

## 2019-10-31 MED ORDER — EPINEPHRINE 0.3 MG/0.3ML IJ SOAJ: 0.3000 mg | Freq: Once | INTRAMUSCULAR | Status: DC | PRN

## 2019-10-31 MED ORDER — SODIUM CHLORIDE 0.9 % IV SOLN
INTRAVENOUS | Status: DC | PRN
  Administered 2019-10-31: 11:00:00 250 mL via INTRAVENOUS

## 2019-10-31 MED ORDER — FAMOTIDINE IN NACL 20-0.9 MG/50ML-% IV SOLN: 20.0000 mg | Freq: Once | INTRAVENOUS | Status: DC | PRN

## 2019-10-31 MED ORDER — METHYLPREDNISOLONE SODIUM SUCC 125 MG IJ SOLR: 125.0000 mg | Freq: Once | INTRAMUSCULAR | Status: DC | PRN

## 2019-10-31 MED ORDER — ALBUTEROL SULFATE HFA 108 (90 BASE) MCG/ACT IN AERS: 2.0000 | INHALATION_SPRAY | Freq: Once | RESPIRATORY_TRACT | Status: DC | PRN

## 2019-10-31 MED ORDER — SODIUM CHLORIDE 0.9 % IV SOLN
Freq: Once | INTRAVENOUS | Status: AC
  Filled 2019-10-31: qty 10

## 2019-10-31 MED ORDER — DIPHENHYDRAMINE HCL 50 MG/ML IJ SOLN: 50.0000 mg | Freq: Once | INTRAMUSCULAR | Status: DC | PRN

## 2019-10-31 NOTE — Progress Notes (Signed)
  Diagnosis: COVID-19  Physician: Dr. Wright  Procedure: Covid Infusion Clinic Med: casirivimab\imdevimab infusion - Provided patient with casirivimab\imdevimab fact sheet for patients, parents and caregivers prior to infusion.  Complications: No immediate complications noted.  Discharge: Discharged home   Gabrielle Owens A 10/31/2019  

## 2019-10-31 NOTE — Discharge Instructions (Signed)

## 2019-11-12 ENCOUNTER — Other Ambulatory Visit: Payer: Self-pay | Admitting: Family Medicine

## 2019-11-14 ENCOUNTER — Other Ambulatory Visit: Payer: Self-pay | Admitting: Family Medicine

## 2019-12-09 ENCOUNTER — Other Ambulatory Visit: Payer: Self-pay | Admitting: Family Medicine

## 2019-12-09 DIAGNOSIS — E119 Type 2 diabetes mellitus without complications: Secondary | ICD-10-CM

## 2019-12-09 NOTE — Telephone Encounter (Signed)
Courtesy refill  

## 2019-12-15 ENCOUNTER — Ambulatory Visit: Payer: Self-pay | Admitting: Family Medicine

## 2019-12-15 NOTE — Progress Notes (Deleted)
{  This SmartText note template is in development as part of the [facility].   This template contains optional SmartLists. If no selections are made from an optional SmartList, it will be automatically removed when the note is signed.  Left clicking SmartLists that are in blue, underlined text will show additional information regarding the patient's history.  This text will be automatically removed from this note when it is signed.:22601}   Patient: Gabrielle Owens   DOB: April 04, 1971   49 y.o. Female  MRN: [id number] Visit Date: 12/16/2019  Today's Provider: Lelon Huh, MD  Subjective:   No chief complaint on file.  HPI  Diabetes Mellitus Type II, Follow-up:   Lab Results  Component Value Date   HGBA1C 6.4 (H) 05/01/2018   HGBA1C 7.8 07/12/2017   HGBA1C 9.9 10/07/2015   Last seen for diabetes over 1 years ago.  Management since then includes no change. She reports good compliance with treatment. She is not having side effects.  Current symptoms include none  Home blood sugar records: {diabetes glucometry results:16657}  Episodes of hypoglycemia? {yes***/no:17258}   Current Insulin Regimen: N/A Most Recent Eye Exam: not UTD Weight trend: stable Prior visit with dietician: {yes/no:17258} Current diet: {diet habits:16563} Current exercise: {exercise types:16438}  ------------------------------------------------------------------------   Hypertension, follow-up:  BP Readings from Last 3 Encounters:  10/31/19 (!) 174/92  02/11/19 (!) 143/88  09/13/18 (!) 177/111    She was last seen for hypertension over 1 years ago.  BP at that visit was 140/88.  Management since that visit includes no change. Followed by Dr. Saralyn Pilar.   She reports good compliance with treatment. She is not having side effects.  She {is/is not:9024} exercising. She {is/is not:9024} adherent to low salt diet.   Outside blood pressures are ***. She is  experiencing none.  Patient denies chest pain, chest pressure/discomfort, irregular heart beat and palpitations.   Cardiovascular risk factors include diabetes mellitus, dyslipidemia and hypertension.  Use of agents associated with hypertension: none.   ------------------------------------------------------------------------    Lipid/Cholesterol, Follow-up:   Last seen for this over 1 years ago.  Management since that visit includes no change.  Last Lipid Panel:    Component Value Date/Time   CHOL 180 05/01/2018 1510   CHOL 377 (H) 11/20/2012 0800   TRIG 453 (H) 05/01/2018 1510   TRIG 979 (H) 11/20/2012 0800   HDL 29 (L) 05/01/2018 1510   HDL 23 (L) 11/20/2012 0800   CHOLHDL 6.2 (H) 05/01/2018 1510   VLDL SEE COMMENT 11/20/2012 0800   LDLCALC Comment 05/01/2018 1510   LDLCALC SEE COMMENT 11/20/2012 0800    She reports good compliance with treatment. She is not having side effects.   Wt Readings from Last 3 Encounters:  02/11/19 198 lb 9.6 oz (90.1 kg)  09/13/18 185 lb (83.9 kg)  06/28/18 186 lb (84.4 kg)    ------------------------------------------------------------------------   {Show patient history (optional):23735}   Medications: Outpatient Medications Prior to Visit  Medication Sig Dispense Refill  . aspirin 81 MG tablet Take 81 mg by mouth daily.    Marland Kitchen escitalopram (LEXAPRO) 10 MG tablet TAKE 1/2 TABLET (5MG ) BY MOUTH AT BEDTIME FOR ONE WEEK, THEN TAKE 1 TABLET(10MG  TOTAL) BY MOUTH AT BEDTIME 30 tablet 5  . escitalopram (LEXAPRO) 20 MG tablet Take 1 tablet (20 mg total) by mouth at bedtime. 30 tablet 1  . glipiZIDE (GLUCOTROL) 5 MG tablet Take 1 tablet (5 mg total) by mouth 2 (two)  times daily before a meal. (Patient not taking: Reported on 09/13/2018) 180 tablet 3  . hydrochlorothiazide (HYDRODIURIL) 25 MG tablet HYDROCHLOROTHIAZIDE, 25MG  (Oral Tablet)  1 every day for 0 days  Quantity: 0.00;  Refills: 0   Ordered :31-Jan-2012  06-25-1995 ;  Started  31-Dec-2008 Active    . losartan (COZAAR) 100 MG tablet Take 100 mg by mouth daily.    . metFORMIN (GLUCOPHAGE) 1000 MG tablet TAKE 1 TABLET BY MOUTH TWICE DAILY WITH A MEAL 60 tablet 0  . metoprolol (LOPRESSOR) 100 MG tablet Take 1 tablet by mouth 2 (two) times daily.    07-12-1978 PROAIR HFA 108 (90 Base) MCG/ACT inhaler     . simvastatin (ZOCOR) 20 MG tablet simvastatin 20 mg tablet    . triamcinolone cream (KENALOG) 0.1 % Apply 1 application topically 2 (two) times daily. 30 g 0   No facility-administered medications prior to visit.    {Show previous labs (optional):23736}  Review of Systems    Objective:    There were no vitals taken for this visit. {Show previous vitals signs (optional):23737}  Physical Exam    No results found for any visits on 12/16/19.    Assessment & Plan:    ***    12/18/19, MD  [facility] [phone] (phone) [phone] (fax)  [facility]

## 2019-12-16 ENCOUNTER — Ambulatory Visit: Payer: Self-pay | Admitting: Family Medicine

## 2020-01-15 LAB — HM DIABETES EYE EXAM

## 2020-01-27 ENCOUNTER — Other Ambulatory Visit: Payer: Self-pay | Admitting: Family Medicine

## 2020-01-27 DIAGNOSIS — E119 Type 2 diabetes mellitus without complications: Secondary | ICD-10-CM

## 2020-01-27 NOTE — Telephone Encounter (Signed)
Requested medication (s) are due for refill today: yes  Requested medication (s) are on the active medication list: yes  Last refill: 12/09/19 courtesy refill  Future visit scheduled:No attempted to contact patient. I was told it was wrong number.  Notes to clinic:  unable to contact patient to schedule appointment Number is wrong.    Requested Prescriptions  Pending Prescriptions Disp Refills   metFORMIN (GLUCOPHAGE) 1000 MG tablet [Pharmacy Med Name: METFORMIN HCL 1000 MG TAB] 60 tablet 0    Sig: TAKE 1 TABLET BY MOUTH TWICE DAILY WITH A MEAL      Endocrinology:  Diabetes - Biguanides Failed - 01/27/2020  4:54 PM      Failed - Cr in normal range and within 360 days    Creatinine  Date Value Ref Range Status  11/21/2012 0.48 (L) 0.60 - 1.30 mg/dL Final   Creatinine, Ser  Date Value Ref Range Status  05/01/2018 0.57 0.57 - 1.00 mg/dL Final          Failed - HBA1C is between 0 and 7.9 and within 180 days    Hemoglobin A1C  Date Value Ref Range Status  11/20/2012 7.6 (H) 4.2 - 6.3 % Final    Comment:    The American Diabetes Association recommends that a primary goal of therapy should be <7% and that physicians should reevaluate the treatment regimen in patients with HbA1c values consistently >8%.    Hgb A1c MFr Bld  Date Value Ref Range Status  05/01/2018 6.4 (H) 4.8 - 5.6 % Final    Comment:             Prediabetes: 5.7 - 6.4          Diabetes: >6.4          Glycemic control for adults with diabetes: <7.0           Failed - eGFR in normal range and within 360 days    EGFR (African American)  Date Value Ref Range Status  11/21/2012 >60  Final   GFR calc Af Amer  Date Value Ref Range Status  05/01/2018 128 >59 mL/min/1.73 Final   EGFR (Non-African Amer.)  Date Value Ref Range Status  11/21/2012 >60  Final    Comment:    eGFR values <30m/min/1.73 m2 may be an indication of chronic kidney disease (CKD). Calculated eGFR is useful in patients with stable  renal function. The eGFR calculation will not be reliable in acutely ill patients when serum creatinine is changing rapidly. It is not useful in  patients on dialysis. The eGFR calculation may not be applicable to patients at the low and high extremes of body sizes, pregnant women, and vegetarians. POTASSIUM - Slight hemolysis, interpret results with  - caution...tpl METB - SPECIMEN WAS ULTRAFUGED...TPL    GFR calc non Af Amer  Date Value Ref Range Status  05/01/2018 111 >59 mL/min/1.73 Final          Failed - Valid encounter within last 6 months    Recent Outpatient Visits           11 months ago [facility]  1 year ago Menopausal and female climacteric states   [place]  1 year ago Acute midline low back pain without sciatica   [facility]   1 year ago [facility]   2 years ago Sinusitis, unspecified chronicity, unspecified location   [facility] Caryn Section, Kirstie Peri, MD

## 2020-05-04 ENCOUNTER — Telehealth (INDEPENDENT_AMBULATORY_CARE_PROVIDER_SITE_OTHER): Payer: Medicaid Other | Admitting: Physician Assistant

## 2020-05-04 ENCOUNTER — Telehealth: Payer: Self-pay

## 2020-05-04 DIAGNOSIS — R197 Diarrhea, unspecified: Secondary | ICD-10-CM

## 2020-05-04 NOTE — Progress Notes (Signed)
MyChart Video Visit    Virtual Visit via Video Note   This visit type was conducted due to national recommendations for restrictions regarding the COVID-19 Pandemic (e.g. social distancing) in an effort to limit this patient's exposure and mitigate transmission in our community. This patient is at least at moderate risk for complications without adequate follow up. This format is felt to be most appropriate for this patient at this time. Physical exam was limited by quality of the video and audio technology used for the visit.   Patient location: Home Provider location: Office   I discussed the limitations of evaluation and management by telemedicine and the availability of in person appointments. The patient expressed understanding and agreed to proceed.   Patient: Gabrielle Owens   DOB: 29-Nov-1970   49 y.o. Female  MRN: [id number] Visit Date: 05/04/2020  Today's healthcare provider: Trey Sailors, PA-C   Chief Complaint  Patient presents with  . Diarrhea   Subjective    Diarrhea  This is a new problem. The current episode started in the past 7 days. The problem has been resolved. The patient states that diarrhea does not awaken her from sleep. Pertinent negatives include no abdominal pain, bloating or chills. Nothing aggravates the symptoms. There are no known risk factors. She has tried nothing for the symptoms. The treatment provided no relief.   Patient states she needs a work note. Reports she had a suspicious food intake on Sunday and felt poorly on Monday. She reports her symptoms have resolved. She had COVID 19 in late January and received monoclonal antibody infusion on 10/31/2019.     Medications: Outpatient Medications Prior to Visit  Medication Sig  . aspirin 81 MG tablet Take 81 mg by mouth daily.  Marland Kitchen escitalopram (LEXAPRO) 10 MG tablet TAKE 1/2 TABLET (5MG ) BY MOUTH AT BEDTIME FOR ONE WEEK, THEN TAKE 1 TABLET(10MG  TOTAL) BY MOUTH AT BEDTIME  . escitalopram  (LEXAPRO) 20 MG tablet Take 1 tablet (20 mg total) by mouth at bedtime.  glipiZIDE (GLUCOTROL) 5 MG tablet Take 1 tablet (5 mg total) by mouth 2 (two) times daily before a meal. (Patient not taking: Reported on 09/13/2018)  . hydrochlorothiazide (HYDRODIURIL) 25 MG tablet HYDROCHLOROTHIAZIDE, 25MG  (Oral Tablet)  1 every day for 0 days  Quantity: 0.00;  Refills: 0   Ordered :31-Jan-2012  ;  Started 31-Dec-2008 Active  . losartan (COZAAR) 100 MG tablet Take 100 mg by mouth daily.  . metFORMIN (GLUCOPHAGE) 1000 MG tablet TAKE 1 TABLET BY MOUTH TWICE DAILY WITH A MEAL  . metoprolol (LOPRESSOR) 100 MG tablet Take 1 tablet by mouth 2 (two) times daily.  Janey Greaser PROAIR HFA 108 (90 Base) MCG/ACT inhaler   . simvastatin (ZOCOR) 20 MG tablet simvastatin 20 mg tablet  . triamcinolone cream (KENALOG) 0.1 % Apply 1 application topically 2 (two) times daily.   No facility-administered medications prior to visit.    Review of Systems  Constitutional: Negative for chills.  Gastrointestinal: Positive for diarrhea. Negative for abdominal pain and bloating.      Objective    There were no vitals taken for this visit.   Physical Exam Constitutional:      Appearance: Normal appearance.  Neurological:     Mental Status: She is alert.  Psychiatric:        Mood and Affect: Mood normal.        Behavior: Behavior normal.        Assessment & Plan  1. Diarrhea, unspecified type  Symptoms resolved, work note provided.      Return if symptoms worsen or fail to improve.     I discussed the assessment and treatment plan with the patient. The patient was provided an opportunity to ask questions and all were answered. The patient agreed with the plan and demonstrated an understanding of the instructions.   The patient was advised to call back or seek an in-person evaluation if the symptoms worsen or if the condition fails to improve as anticipated.   ITrey Sailors, PA-C,  have reviewed all documentation for this visit. The documentation on 05/04/20 for the exam, diagnosis, procedures, and orders are all accurate and complete.   Maryella Shivers [facility] [phone] (phone) [phone] (fax)  [facility]

## 2020-05-04 NOTE — Telephone Encounter (Signed)
Copied from CRM [id number]. Topic: Quick Communication - See Telephone Encounter >> May 04, 2020  8:27 AM Aretta Nip wrote: CRM for notification. See Telephone encounter for: 05/04/20. Pt states she got sick Sunday Nite with diarrhea from eating [place] [employer]. She was unable to go to work yesterday. She is better now but she is unable to return to work without a dr. Note. Pt states she is ok now. Pt was going to do a MyChart visit and after calling office I have verified there are no available appts. Was advised to send Dr Sherrie Mustache a note to see if maybe nurse could reach out to pt and they write her a dr note. Pls FU with pt at [phone].

## 2020-05-04 NOTE — Telephone Encounter (Signed)
Patient scheduled for a my chart visit for this afternoon with Adriana.

## 2020-05-11 ENCOUNTER — Telehealth: Payer: Medicaid Other | Admitting: Family Medicine

## 2020-06-09 ENCOUNTER — Other Ambulatory Visit: Payer: Self-pay | Admitting: Family Medicine

## 2020-06-09 NOTE — Telephone Encounter (Signed)
Requested medication (s) are due for refill today:yes  Requested medication (s) are on the active medication list: yes  Last refill:  11/14/19  #30  5 refills  Future visit scheduled: No  Notes to clinic:  Needs new Sig    Requested Prescriptions  Pending Prescriptions Disp Refills   escitalopram (LEXAPRO) 10 MG tablet [Pharmacy Med Name: ESCITALOPRAM OXALATE 10 MG TAB] 30 tablet 5    Sig: TAKE 1/2 TABLET (5MG ) BY MOUTH AT BEDTIME FOR ONE WEEK, THEN TAKE 1 TABLET(10MG  TOTAL) BY MOUTH AT BEDTIME      Psychiatry:  Antidepressants - SSRI Passed - 06/09/2020  4:29 PM      Passed - Valid encounter within last 6 months    Recent Outpatient Visits           1 month ago Diarrhea, unspecified type   [facility] [place], [place], PA-C   1 year ago Facial rash   [facility] [place], [place], Lavella Hammock   1 year ago Menopausal and female climacteric states   [facility] [place], [place], [place]   1 year ago Acute midline low back pain without sciatica   [facility], [place]   2 years ago Annual physical exam   [facility] M, M

## 2020-08-24 NOTE — Progress Notes (Signed)
Established patient visit   Patient: Gabrielle Owens   DOB: 08/23/1971   49 y.o. Female  MRN: [id number] Visit Date: 08/25/2020  Today's healthcare provider: Mila Merry, MD   Chief Complaint  Patient presents with  . Diabetes  . Hyperlipidemia  . Hypertension  . Anxiety   Subjective    HPI  Diabetes Mellitus Type II, Follow-up  Lab Results  Component Value Date   HGBA1C 6.1 (A) 08/25/2020   HGBA1C 6.4 (H) 05/01/2018   HGBA1C 7.8 07/12/2017   Wt Readings from Last 3 Encounters:  08/25/20 189 lb (85.7 kg)  02/11/19 198 lb 9.6 oz (90.1 kg)  09/13/18 185 lb (83.9 kg)   Last seen for diabetes 05/01/2018 (seen by Joycelyn Man, PA-C).  Management since then includes continue same medication. She reports good compliance with treatment. She is not having side effects.  Symptoms: No fatigue No foot ulcerations  No appetite changes No nausea  No paresthesia of the feet  No polydipsia  No polyuria No visual disturbances   No vomiting     Home blood sugar records: not being checked.  Episodes of hypoglycemia? No    Current insulin regiment: none Most Recent Eye Exam: due Current exercise: no regular exercise Current diet habits: well balanced  Pertinent Labs: Lab Results  Component Value Date   CHOL 180 05/01/2018   HDL 29 (L) 05/01/2018   LDLCALC Comment 05/01/2018   TRIG 453 (H) 05/01/2018   CHOLHDL 6.2 (H) 05/01/2018   Lab Results  Component Value Date   NA 136 05/01/2018   K 4.3 05/01/2018   CREATININE 0.57 05/01/2018   GFRNONAA 111 05/01/2018   GFRAA 128 05/01/2018   GLUCOSE 149 (H) 05/01/2018      Lipid/Cholesterol, Follow-up  Last lipid panel Other pertinent labs  Lab Results  Component Value Date   CHOL 180 05/01/2018   HDL 29 (L) 05/01/2018   LDLCALC Comment 05/01/2018   TRIG 453 (H) 05/01/2018   CHOLHDL 6.2 (H) 05/01/2018   Lab Results  Component Value Date   ALT 22 05/01/2018   AST 19 05/01/2018   PLT 394 05/01/2018    TSH 1.700 05/01/2018     She was last seen for this 2 years ago.  Management since that visit includes counseling patient to work on dietary changes.  She reports good compliance with treatment. She is not having side effects.   Symptoms: No chest pain No chest pressure/discomfort  No dyspnea No lower extremity edema  No numbness or tingling of extremity No orthopnea  No palpitations No paroxysmal nocturnal dyspnea  No speech difficulty No syncope   Current diet: well balanced Current exercise: no regular exercise  The 10-year ASCVD risk score Denman George DC Jr., et al., 2013) is: 8.8%   Anxiety, Follow-up  Current treatment includes Lexapro.    She reports good compliance with treatment. She reports good tolerance of treatment. She is not having side effects.   She feels her anxiety is moderate and Worse since last visit. She had trouble getting the escitalopram refilled and has now been out of it for the last month.  Symptoms: No chest pain Yes difficulty concentrating  No dizziness No fatigue  No feelings of losing control No insomnia  Yes irritable No palpitations  Yes panic attacks No racing thoughts  No shortness of breath No sweating  No tremors/shakes    GAD-7 Results GAD-7 Generalized Anxiety Disorder Screening Tool 08/25/2020  1. Feeling Nervous, Anxious, or on  Edge 3  2. Not Being Able to Stop or Control Worrying 3  3. Worrying Too Much About Different Things 3  4. Trouble Relaxing 3  5. Being So Restless it's Hard To Sit Still 3  6. Becoming Easily Annoyed or Irritable 3  7. Feeling Afraid As If Something Awful Might Happen 3  Total GAD-7 Score 21  Difficulty At Work, Home, or Getting  Along With Others? Very difficult    PHQ-9 Scores PHQ9 SCORE ONLY 08/25/2020 05/01/2018  PHQ-9 Total Score 13 18       Medications: Outpatient Medications Prior to Visit  Medication Sig  . aspirin 81 MG tablet Take 81 mg by mouth daily.  Marland Kitchen escitalopram (LEXAPRO)  10 MG tablet Take 0.5 tablets (5 mg total) by mouth at bedtime. Please schedule office visit before any future refills.  . hydrochlorothiazide (HYDRODIURIL) 25 MG tablet HYDROCHLOROTHIAZIDE, 25MG  (Oral Tablet)  1 every day for 0 days  Quantity: 0.00;  Refills: 0   Ordered :31-Jan-2012  06-25-1995 ;  Started 31-Dec-2008 Active  . losartan (COZAAR) 100 MG tablet Take 100 mg by mouth daily.  . metFORMIN (GLUCOPHAGE) 1000 MG tablet TAKE 1 TABLET BY MOUTH TWICE DAILY WITH A MEAL  . metoprolol (LOPRESSOR) 100 MG tablet Take 1 tablet by mouth 2 (two) times daily.  07-12-1978 PROAIR HFA 108 (90 Base) MCG/ACT inhaler   . simvastatin (ZOCOR) 20 MG tablet simvastatin 20 mg tablet  . escitalopram (LEXAPRO) 20 MG tablet Take 1 tablet (20 mg total) by mouth at bedtime. (Patient not taking: Reported on 08/25/2020)  . glipiZIDE (GLUCOTROL) 5 MG tablet Take 1 tablet (5 mg total) by mouth 2 (two) times daily before a meal. (Patient not taking: Reported on 09/13/2018)  . triamcinolone cream (KENALOG) 0.1 % Apply 1 application topically 2 (two) times daily. (Patient not taking: Reported on 08/25/2020)   No facility-administered medications prior to visit.    Review of Systems  Constitutional: Negative.   Respiratory: Negative.   Cardiovascular: Negative.   Gastrointestinal: Negative.   Endocrine: Negative.   Musculoskeletal: Negative.   Neurological: Negative for dizziness.      Objective    BP (!) 154/82   Pulse 80   Temp 97.9 F (36.6 C)   Resp 16   Ht 5\' 2"  (1.575 m)   Wt 189 lb (85.7 kg)   BMI 34.57 kg/m    Physical Exam   General: Appearance:    Mildly obese female in no acute distress  Eyes:    PERRL, conjunctiva/corneas clear, EOM's intact       Lungs:     Clear to auscultation bilaterally, respirations unlabored  Heart:    Normal heart rate. Normal rhythm. No murmurs, rubs, or gallops.   MS:   All extremities are intact.   Neurologic:   Awake, alert, oriented x 3. No apparent focal  neurological           defect.         Results for orders placed or performed in visit on 08/25/20  POCT glycosylated hemoglobin (Hb A1C)  Result Value Ref Range   Hemoglobin A1C 6.1 (A) 4.0 - 5.6 %   Est. average glucose Bld gHb Est-mCnc 128     Assessment & Plan     1. Essential (primary) hypertension sbp somewhat elevated, but usually better controlled.   2. Diabetes mellitus with no complication (HCC) Well controlled on current dose of  metFORMIN (GLUCOPHAGE) 1000 MG tablet; Take 1 tablet (1,000 mg  total) by mouth 2 (two) times daily with a meal.  Dispense: 180 tablet; Refill: 3. Encourage healthy eating habits and regular exercise.   3. Need for influenza vaccination  - Flu Vaccine QUAD 6+ mos PF IM (Fluarix Quad PF)  4. Breast cancer screening by mammogram  - MM 3D Screening Breast Bilateral - [facility]; Future  5. Colon cancer screening  - Ambulatory referral to gastroenterology for colonoscopy  6. Need for hepatitis C screening test  - Hepatitis C antibody  7. Arteriosclerosis of coronary artery Asymptomatic. Compliant with medication.  Continue aggressive risk factor modification.  On statin.   8. Hypertriglyceridemia On Statin.  - CBC - Lipid panel - Comprehensive metabolic panel  9. Fam hx-ischem heart disease   10. Anxiety Has been out of lexaprol, will restart at 10mg  but advised can titrate up to 20 if needed.  - escitalopram (LEXAPRO) 10 MG tablet; Take 1 tablet (10 mg total) by mouth at bedtime.  Dispense: 90 tablet; Refill: 3         The entirety of the information documented in the History of Present Illness, Review of Systems and Physical Exam were personally obtained by me. Portions of this information were initially documented by the CMA and reviewed by me for thoroughness and accuracy.      , MD  [facility] [phone] (phone) [phone] (fax)  [facility]

## 2020-08-25 ENCOUNTER — Ambulatory Visit (INDEPENDENT_AMBULATORY_CARE_PROVIDER_SITE_OTHER): Payer: Medicaid Other | Admitting: Family Medicine

## 2020-08-25 ENCOUNTER — Other Ambulatory Visit: Payer: Self-pay

## 2020-08-25 ENCOUNTER — Encounter: Payer: Self-pay | Admitting: Family Medicine

## 2020-08-25 VITALS — BP 154/82 | HR 80 | Temp 97.9°F | Resp 16 | Ht 62.0 in | Wt 189.0 lb

## 2020-08-25 DIAGNOSIS — Z23 Encounter for immunization: Secondary | ICD-10-CM | POA: Diagnosis not present

## 2020-08-25 DIAGNOSIS — E119 Type 2 diabetes mellitus without complications: Secondary | ICD-10-CM | POA: Diagnosis not present

## 2020-08-25 DIAGNOSIS — Z1211 Encounter for screening for malignant neoplasm of colon: Secondary | ICD-10-CM

## 2020-08-25 DIAGNOSIS — I10 Essential (primary) hypertension: Secondary | ICD-10-CM | POA: Diagnosis not present

## 2020-08-25 DIAGNOSIS — Z1231 Encounter for screening mammogram for malignant neoplasm of breast: Secondary | ICD-10-CM | POA: Diagnosis not present

## 2020-08-25 DIAGNOSIS — F419 Anxiety disorder, unspecified: Secondary | ICD-10-CM

## 2020-08-25 DIAGNOSIS — Z8249 Family history of ischemic heart disease and other diseases of the circulatory system: Secondary | ICD-10-CM

## 2020-08-25 DIAGNOSIS — Z1159 Encounter for screening for other viral diseases: Secondary | ICD-10-CM

## 2020-08-25 DIAGNOSIS — E781 Pure hyperglyceridemia: Secondary | ICD-10-CM

## 2020-08-25 DIAGNOSIS — I2510 Atherosclerotic heart disease of native coronary artery without angina pectoris: Secondary | ICD-10-CM

## 2020-08-25 LAB — POCT GLYCOSYLATED HEMOGLOBIN (HGB A1C)
Est. average glucose Bld gHb Est-mCnc: 128
Hemoglobin A1C: 6.1 % — AB (ref 4.0–5.6)

## 2020-08-25 MED ORDER — ESCITALOPRAM OXALATE 10 MG PO TABS: 10.0000 mg | ORAL_TABLET | Freq: Every day | ORAL | 3 refills | Status: DC

## 2020-08-25 MED ORDER — METFORMIN HCL 1000 MG PO TABS: 1000.0000 mg | ORAL_TABLET | Freq: Two times a day (BID) | ORAL | 3 refills | Status: DC

## 2020-08-25 NOTE — Patient Instructions (Addendum)
   Please call the [facility] at [phone] to schedule your mammogram.  . Please go to the lab draw station in [address] on the second floor of [facility]  when you are fasting for 8 hours. Normal hours are 8:00am to 11:30am and 1:00pm to 4:00pm Monday through Friday

## 2020-09-07 ENCOUNTER — Encounter: Payer: Self-pay | Admitting: *Deleted

## 2020-09-16 ENCOUNTER — Ambulatory Visit: Payer: Self-pay

## 2020-10-06 ENCOUNTER — Telehealth (INDEPENDENT_AMBULATORY_CARE_PROVIDER_SITE_OTHER): Payer: Medicaid Other | Admitting: Physician Assistant

## 2020-10-06 DIAGNOSIS — U071 COVID-19: Secondary | ICD-10-CM | POA: Diagnosis not present

## 2020-10-06 NOTE — Progress Notes (Signed)
MyChart Video Visit    Virtual Visit via Video Note   This visit type was conducted due to national recommendations for restrictions regarding the COVID-19 Pandemic (e.g. social distancing) in an effort to limit this patient's exposure and mitigate transmission in our community. This patient is at least at moderate risk for complications without adequate follow up. This format is felt to be most appropriate for this patient at this time. Physical exam was limited by quality of the video and audio technology used for the visit.   Patient location: Home Provider location: Office   I discussed the limitations of evaluation and management by telemedicine and the availability of in person appointments. The patient expressed understanding and agreed to proceed.  Patient: Gabrielle Owens   DOB: [birth year]   50 y.o. Female  MRN: [id number] Visit Date: 10/06/2020  Today's healthcare provider: Trey Sailors, PA-C   Chief Complaint  Patient presents with  . Covid Positive  I,Adriana M Pollak,acting as a scribe for Trey Sailors, PA-C.,have documented all relevant documentation on the behalf of Trey Sailors, PA-C,as directed by  Trey Sailors, PA-C while in the presence of Trey Sailors, PA-C.  Subjective    URI  This is a new problem. The current episode started in the past 7 days. The problem has been unchanged. Associated symptoms include congestion, diarrhea, headaches, sinus pain and a sore throat. Pertinent negatives include no coughing, ear pain, nausea, rhinorrhea, vomiting or wheezing. She has tried increased fluids for the symptoms. The treatment provided mild relief.  Patient states she took a home test and it was positive on 10/03/2020. Symptoms began 09/29/2020 with mild fever, sore throat. These have resolved now, only fatigue continues. Last COVID infection 10/2019 and she received MAB infusion at the time.      Medications: Outpatient Medications Prior to  Visit  Medication Sig  . aspirin 81 MG tablet Take 81 mg by mouth daily.  Marland Kitchen escitalopram (LEXAPRO) 10 MG tablet Take 1 tablet (10 mg total) by mouth at bedtime.  . hydrochlorothiazide (HYDRODIURIL) 25 MG tablet HYDROCHLOROTHIAZIDE, 25MG  (Oral Tablet)  1 every day for 0 days  Quantity: 0.00;  Refills: 0   Ordered :31-Jan-2012  06-25-1995 ;  Started 31-Dec-2008 Active  . losartan (COZAAR) 100 MG tablet Take 100 mg by mouth daily.  . metFORMIN (GLUCOPHAGE) 1000 MG tablet Take 1 tablet (1,000 mg total) by mouth 2 (two) times daily with a meal.  . metoprolol (LOPRESSOR) 100 MG tablet Take 1 tablet by mouth 2 (two) times daily.  07-12-1978 PROAIR HFA 108 (90 Base) MCG/ACT inhaler   . simvastatin (ZOCOR) 20 MG tablet simvastatin 20 mg tablet  . triamcinolone cream (KENALOG) 0.1 % Apply 1 application topically 2 (two) times daily. (Patient not taking: No sig reported)   No facility-administered medications prior to visit.    Review of Systems  Constitutional: Positive for chills.  HENT: Positive for congestion, sinus pressure, sinus pain and sore throat. Negative for ear pain, postnasal drip and rhinorrhea.   Respiratory: Negative for cough, chest tightness, shortness of breath and wheezing.   Gastrointestinal: Positive for diarrhea. Negative for nausea and vomiting.  Neurological: Positive for headaches.      Objective    There were no vitals taken for this visit.   Physical Exam Constitutional:      Appearance: Normal appearance.  Pulmonary:     Effort: Pulmonary effort is normal. No respiratory distress.  Neurological:  Mental Status: She is alert.  Psychiatric:        Mood and Affect: Mood normal.        Behavior: Behavior normal.        Assessment & Plan    1. COVID-19  Counseled that people with COVID-19 should isolate for 5 days and if they are asymptomatic or their symptoms are resolving (without fever for 24 hours), follow that by 5 days of wearing a mask when around  others to minimize the risk of infecting people they encounter. Advised on supportive care including tylenol and ibuprofen as tolerated/indicated, delsym, and fluids. Return precautions advised.   Work note provided.  Return if symptoms worsen or fail to improve.     I discussed the assessment and treatment plan with the patient. The patient was provided an opportunity to ask questions and all were answered. The patient agreed with the plan and demonstrated an understanding of the instructions.   The patient was advised to call back or seek an in-person evaluation if the symptoms worsen or if the condition fails to improve as anticipated.   ITrey Sailors, PA-C, have reviewed all documentation for this visit. The documentation on 10/06/20 for the exam, diagnosis, procedures, and orders are all accurate and complete.  The entirety of the information documented in the History of Present Illness, Review of Systems and Physical Exam were personally obtained by me. Portions of this information were initially documented by [facility] and reviewed by me for thoroughness and accuracy.    Maryella Shivers [facility] [phone] (phone) [phone] (fax)  [facility]

## 2020-11-01 ENCOUNTER — Other Ambulatory Visit: Payer: Self-pay

## 2020-11-01 ENCOUNTER — Encounter: Payer: Self-pay | Admitting: Family Medicine

## 2020-11-01 ENCOUNTER — Ambulatory Visit (INDEPENDENT_AMBULATORY_CARE_PROVIDER_SITE_OTHER): Payer: Medicaid Other | Admitting: Family Medicine

## 2020-11-01 VITALS — BP 138/94 | HR 66 | Temp 97.3°F | Resp 16 | Ht 62.0 in | Wt 193.6 lb

## 2020-11-01 DIAGNOSIS — Z20822 Contact with and (suspected) exposure to covid-19: Secondary | ICD-10-CM | POA: Diagnosis not present

## 2020-11-01 NOTE — Patient Instructions (Signed)
.   Please review the attached list of medications and notify my office if there are any errors.   . Please bring all of your medications to every appointment so we can make sure that our medication list is the same as yours.   

## 2020-11-01 NOTE — Progress Notes (Signed)
Established patient visit   Patient: Gabrielle Owens   DOB: Nov 27, 1970   50 y.o. Female  MRN: [id number] Visit Date: 11/01/2020  Today's healthcare provider: Mila Merry, MD   Chief Complaint  Patient presents with  . Follow-up   Subjective    HPI    Follow up for covid-19  The patient was last seen for this 4 weeks ago. Changes made at last visit include no medication changes.  She reports good compliance with treatment. She feels that condition is Improved. She is not having side effects. Patient reports that she needs a note saying that she can return back to work. Patient was seen via video visit on 10/06/20 by Osvaldo Angst, PA. She was due to go back to work on 10/08/20. However, patient reports that she was exposed by a co-worker on January 21st and was told she had to have a negative test before returning to work. She has not had any covid symptoms since exposure and had negative PCR Covid test collected on 10-25-2020. She needs a note to return to work.        Medications: Outpatient Medications Prior to Visit  Medication Sig  . aspirin 81 MG tablet Take 81 mg by mouth daily.  Marland Kitchen escitalopram (LEXAPRO) 10 MG tablet Take 1 tablet (10 mg total) by mouth at bedtime.  Marland Kitchen losartan (COZAAR) 100 MG tablet Take 100 mg by mouth daily.  . metFORMIN (GLUCOPHAGE) 1000 MG tablet Take 1 tablet (1,000 mg total) by mouth 2 (two) times daily with a meal.  . metoprolol (LOPRESSOR) 100 MG tablet Take 1 tablet by mouth 2 (two) times daily.  Marland Kitchen PROAIR HFA 108 (90 Base) MCG/ACT inhaler   . simvastatin (ZOCOR) 20 MG tablet simvastatin 20 mg tablet  . triamcinolone cream (KENALOG) 0.1 % Apply 1 application topically 2 (two) times daily.  . hydrochlorothiazide (HYDRODIURIL) 25 MG tablet HYDROCHLOROTHIAZIDE, 25MG  (Oral Tablet)  1 every day for 0 days  Quantity: 0.00;  Refills: 0   Ordered :31-Jan-2012  06-25-1995 ;  Started 31-Dec-2008 Active   No facility-administered  medications prior to visit.    Review of Systems  Constitutional: Negative.   Respiratory: Negative for cough and shortness of breath.   Cardiovascular: Negative for chest pain, palpitations and leg swelling.  Musculoskeletal: Negative.   Neurological: Negative for dizziness and headaches.       Objective    BP (!) 138/94   Pulse 66   Temp (!) 97.3 F (36.3 C)   Resp 16   Ht 5\' 2"  (1.575 m)   Wt 193 lb 9.6 oz (87.8 kg)   BMI 35.41 kg/m     Physical Exam   General: Appearance:    Mildly obese female in no acute distress  Eyes:    PERRL, conjunctiva/corneas clear, EOM's intact       Lungs:     Clear to auscultation bilaterally, respirations unlabored  Heart:    Normal heart rate. Normal rhythm. No murmurs, rubs, or gallops.   MS:   All extremities are intact.   Neurologic:   Awake, alert, oriented x 3. No apparent focal neurological           defect.         Assessment & Plan     1. Exposure to COVID-19 virus Completely asymptomatic with negative PCR test. Moreover had mild Covid 2 weeks before most recent exposure and is likely moderately immune for reinfection at this time. Work  note printed that she may return to work without restriction.   No follow-ups on file.         Mila Merry, MD  [facility] [phone] (phone) [phone] (fax)  [facility]

## 2021-01-06 DIAGNOSIS — I2510 Atherosclerotic heart disease of native coronary artery without angina pectoris: Secondary | ICD-10-CM | POA: Insufficient documentation

## 2021-01-27 DIAGNOSIS — R002 Palpitations: Secondary | ICD-10-CM | POA: Insufficient documentation

## 2021-04-18 ENCOUNTER — Other Ambulatory Visit: Payer: Self-pay | Admitting: Family Medicine

## 2021-05-10 ENCOUNTER — Ambulatory Visit: Payer: Medicaid Other | Admitting: Family Medicine

## 2021-05-10 NOTE — Progress Notes (Deleted)
      Established patient visit   Patient: Gabrielle Owens   DOB: 02-26-1971   50 y.o. Female  MRN: [id number] Visit Date: 05/10/2021  Today's healthcare provider: Mila Merry, MD   No chief complaint on file.  Subjective    HPI  ***  {Show patient history (optional):23778}   Medications: Outpatient Medications Prior to Visit  Medication Sig   aspirin 81 MG tablet Take 81 mg by mouth daily.   escitalopram (LEXAPRO) 10 MG tablet Take 1 tablet (10 mg total) by mouth at bedtime.   hydrochlorothiazide (HYDRODIURIL) 25 MG tablet HYDROCHLOROTHIAZIDE, 25MG  (Oral Tablet)  1 every day for 0 days  Quantity: 0.00;  Refills: 0   Ordered :31-Jan-2012  06-25-1995 ;  Started 31-Dec-2008 Active   losartan (COZAAR) 100 MG tablet Take 100 mg by mouth daily.   metFORMIN (GLUCOPHAGE) 1000 MG tablet Take 1 tablet (1,000 mg total) by mouth 2 (two) times daily with a meal.   metoprolol (LOPRESSOR) 100 MG tablet Take 1 tablet by mouth 2 (two) times daily.   PROAIR HFA 108 (90 Base) MCG/ACT inhaler    simvastatin (ZOCOR) 20 MG tablet simvastatin 20 mg tablet   triamcinolone cream (KENALOG) 0.1 % Apply 1 application topically 2 (two) times daily.   No facility-administered medications prior to visit.    Review of Systems  {Labs  Heme  Chem  Endocrine  Serology  Results Review (optional):23779}   Objective    There were no vitals taken for this visit. {Show previous vital signs (optional):23777}   Physical Exam  ***  No results found for any visits on 05/10/21.  Assessment & Plan     ***  No follow-ups on file.      {provider attestation***:1}   07/10/21, MD  [facility] [phone] (phone) [phone] (fax)  [facility]

## 2021-07-05 ENCOUNTER — Telehealth: Payer: Self-pay | Admitting: *Deleted

## 2021-07-05 NOTE — Telephone Encounter (Signed)
FYI:  called patient to clarify what type of referral she needs and the reason for the referral. Patient states she no longer needs a referral now. He Cardiologist (Dr. Darrold Junker) was able to make the referral. Patient states, Dr. Darrold Junker wants her to see a Endocrinologist to take over managing her diabetes since she has had COVID 3 times, has a history of heart disease and diabetes. Patient states that Dr. 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 Patient states she already has an appointment scheduled with [facility] for management of diabetes. No further action is needed by our office at this time.

## 2021-07-05 NOTE — Telephone Encounter (Signed)
Copied from CRM [id number]. Topic: Referral - Request for Referral >> Jul 05, 2021  9:10 AM Gaetana Michaelis A wrote: Has patient seen PCP for this complaint? Yes.    *If NO, is insurance requiring patient see PCP for this issue before PCP can refer them?  Referral for which specialty: Endocrinology   Preferred provider/office: Dr. Gershon Crane at [facility]   Reason for referral: Patient has kidney concerns

## 2021-07-21 ENCOUNTER — Ambulatory Visit: Payer: Medicaid Other | Admitting: Family Medicine

## 2021-07-26 ENCOUNTER — Other Ambulatory Visit: Payer: Self-pay | Admitting: Student

## 2021-07-26 DIAGNOSIS — S46811A Strain of other muscles, fascia and tendons at shoulder and upper arm level, right arm, initial encounter: Secondary | ICD-10-CM

## 2021-07-26 DIAGNOSIS — M7581 Other shoulder lesions, right shoulder: Secondary | ICD-10-CM

## 2021-07-27 ENCOUNTER — Ambulatory Visit: Payer: Medicaid Other | Admitting: Family Medicine

## 2021-08-02 ENCOUNTER — Ambulatory Visit: Payer: Medicaid Other

## 2021-08-05 ENCOUNTER — Other Ambulatory Visit: Payer: Self-pay | Admitting: Family Medicine

## 2021-08-05 DIAGNOSIS — E119 Type 2 diabetes mellitus without complications: Secondary | ICD-10-CM

## 2021-09-13 ENCOUNTER — Other Ambulatory Visit: Payer: Self-pay | Admitting: Student

## 2021-09-13 ENCOUNTER — Other Ambulatory Visit (HOSPITAL_COMMUNITY): Payer: Self-pay | Admitting: Student

## 2021-09-13 DIAGNOSIS — M4802 Spinal stenosis, cervical region: Secondary | ICD-10-CM

## 2021-09-13 DIAGNOSIS — M5412 Radiculopathy, cervical region: Secondary | ICD-10-CM

## 2021-09-20 ENCOUNTER — Other Ambulatory Visit: Payer: Self-pay

## 2021-09-20 ENCOUNTER — Encounter: Payer: Self-pay | Admitting: Family Medicine

## 2021-09-20 ENCOUNTER — Ambulatory Visit (INDEPENDENT_AMBULATORY_CARE_PROVIDER_SITE_OTHER): Payer: Medicaid Other | Admitting: Family Medicine

## 2021-09-20 VITALS — BP 150/75 | HR 65 | Temp 98.6°F | Resp 16 | Wt 189.0 lb

## 2021-09-20 DIAGNOSIS — E781 Pure hyperglyceridemia: Secondary | ICD-10-CM | POA: Diagnosis not present

## 2021-09-20 DIAGNOSIS — Z1159 Encounter for screening for other viral diseases: Secondary | ICD-10-CM

## 2021-09-20 DIAGNOSIS — F439 Reaction to severe stress, unspecified: Secondary | ICD-10-CM | POA: Diagnosis not present

## 2021-09-20 DIAGNOSIS — E119 Type 2 diabetes mellitus without complications: Secondary | ICD-10-CM | POA: Diagnosis not present

## 2021-09-20 DIAGNOSIS — I10 Essential (primary) hypertension: Secondary | ICD-10-CM

## 2021-09-20 DIAGNOSIS — Z1211 Encounter for screening for malignant neoplasm of colon: Secondary | ICD-10-CM

## 2021-09-20 LAB — POCT GLYCOSYLATED HEMOGLOBIN (HGB A1C)
Est. average glucose Bld gHb Est-mCnc: 140
Hemoglobin A1C: 6.5 % — AB (ref 4.0–5.6)

## 2021-09-20 MED ORDER — ALPRAZOLAM 0.5 MG PO TABS
0.5000 mg | ORAL_TABLET | Freq: Three times a day (TID) | ORAL | 3 refills | Status: DC | PRN
Start: 2021-09-20 — End: 2021-12-16

## 2021-09-20 NOTE — Progress Notes (Signed)
Established patient visit   Patient: Gabrielle Owens   DOB: 06/11/1971   50 y.o. Female  MRN: [id number] Visit Date: 09/20/2021  Today's healthcare provider: Mila Merry, MD   Chief Complaint  Patient presents with   Diabetes   Hypertension   Subjective    HPI  Diabetes Mellitus Type II, Follow-up  Lab Results  Component Value Date   HGBA1C 6.1 (A) 08/25/2020   HGBA1C 6.4 (H) 05/01/2018   HGBA1C 7.8 07/12/2017   Wt Readings from Last 3 Encounters:  09/20/21 189 lb (85.7 kg)  11/01/20 193 lb 9.6 oz (87.8 kg)  08/25/20 189 lb (85.7 kg)   Last seen for diabetes 1  year  ago.  Management since then includes continuing same medication. She reports good compliance with treatment. She is not having side effects.  Symptoms: No fatigue No foot ulcerations  No appetite changes No nausea  No paresthesia of the feet  No polydipsia  No polyuria No visual disturbances   No vomiting     Home blood sugar records:  blood sugars are not checked  Episodes of hypoglycemia? No    Current insulin regiment: none Most Recent Eye Exam: [facility] earlier this year.  Current exercise: none Current diet habits: in general, an "unhealthy" diet  Pertinent Labs: Lab Results  Component Value Date   CHOL 180 05/01/2018   HDL 29 (L) 05/01/2018   LDLCALC Comment 05/01/2018   TRIG 453 (H) 05/01/2018   CHOLHDL 6.2 (H) 05/01/2018   Lab Results  Component Value Date   NA 136 05/01/2018   K 4.3 05/01/2018   CREATININE 0.57 05/01/2018   GFRNONAA 111 05/01/2018     ---------------------------------------------------------------------------------------------------   Hypertension, follow-up  BP Readings from Last 3 Encounters:  11/01/20 (!) 138/94  08/25/20 (!) 154/82  10/31/19 (!) 174/92   Wt Readings from Last 3 Encounters:  09/20/21 189 lb (85.7 kg)  11/01/20 193 lb 9.6 oz (87.8 kg)  08/25/20 189 lb (85.7 kg)     She was last seen for hypertension 1  year   ago.  BP at that visit was 154/82. Management since that visit includes continue same medication.  She reports fair compliance with treatment. She is not having side effects.  She is following a Regular diet. She is not exercising. She does not smoke. This has been managed by Dr. Darrold Junker who she sees annually.  Use of agents associated with hypertension: NSAIDS.   Outside blood pressures are not checked. Symptoms: No chest pain No chest pressure  No palpitations No syncope  No dyspnea No orthopnea  No paroxysmal nocturnal dyspnea No lower extremity edema     ---------------------------------------------------------------------------------------------------   Anxiety, Follow-up  She has stopped taking lexapro since she felt she doesn't needs any medications most of the time. However she does get very emotional this time of year since it is the time of her daughter's birthday who committed suicide several years ago.   Symptoms: No chest pain No difficulty concentrating  No dizziness No fatigue  No feelings of losing control Yes insomnia  Yes irritable No palpitations  No panic attacks No racing thoughts  No shortness of breath No sweating  No tremors/shakes    GAD-7 Results GAD-7 Generalized Anxiety Disorder Screening Tool 08/25/2020  1. Feeling Nervous, Anxious, or on Edge 3  2. Not Being Able to Stop or Control Worrying 3  3. Worrying Too Much About Different Things 3  4. Trouble Relaxing 3  5. Being So Restless it's Hard To Sit Still 3  6. Becoming Easily Annoyed or Irritable 3  7. Feeling Afraid As If Something Awful Might Happen 3  Total GAD-7 Score 21  Difficulty At Work, Home, or Getting  Along With Others? Very difficult    PHQ-9 Scores PHQ9 SCORE ONLY 09/20/2021 08/25/2020 05/01/2018  PHQ-9 Total Score 8 13 18     ---------------------------------------------------------------------------------------------------     Medications: Outpatient Medications  Prior to Visit  Medication Sig   aspirin 81 MG tablet Take 81 mg by mouth daily.   escitalopram (LEXAPRO) 10 MG tablet Take 1 tablet (10 mg total) by mouth at bedtime.   hydrochlorothiazide (HYDRODIURIL) 25 MG tablet HYDROCHLOROTHIAZIDE, 25MG  (Oral Tablet)  1 every day for 0 days   losartan (COZAAR) 100 MG tablet Take 100 mg by mouth daily.   metFORMIN (GLUCOPHAGE) 1000 MG tablet TAKE 1 TABLET BY MOUTH TWICE DAILY WITH A MEAL   metoprolol (LOPRESSOR) 100 MG tablet Take 1 tablet by mouth 2 (two) times daily.   PROAIR HFA 108 (90 Base) MCG/ACT inhaler    simvastatin (ZOCOR) 20 MG tablet simvastatin 20 mg tablet   triamcinolone cream (KENALOG) 0.1 % Apply 1 application topically 2 (two) times daily.   No facility-administered medications prior to visit.    Review of Systems  Constitutional:  Negative for appetite change, chills, fatigue and fever.  Respiratory:  Negative for chest tightness and shortness of breath.   Cardiovascular:  Negative for chest pain and palpitations.  Gastrointestinal:  Negative for abdominal pain, nausea and vomiting.  Neurological:  Negative for dizziness and weakness.  Psychiatric/Behavioral:  The patient is nervous/anxious.       Objective    Pulse 65    Temp 98.6 F (37 C) (Oral)    Resp 16    Wt 189 lb (85.7 kg)    SpO2 100% Comment: room  air   BMI 34.57 kg/m  {Show previous vital signs (optional):23777}  Physical Exam   General: Appearance:    Mildly obese female in no acute distress  Eyes:    PERRL, conjunctiva/corneas clear, EOM's intact       Lungs:     Clear to auscultation bilaterally, respirations unlabored  Heart:    Normal heart rate. Normal rhythm. No murmurs, rubs, or gallops.    MS:   All extremities are intact.    Neurologic:   Awake, alert, oriented x 3. No apparent focal neurological defect.        Results for orders placed or performed in visit on 09/20/21  POCT HgB A1C  Result Value Ref Range   Hemoglobin A1C 6.5 (A) 4.0 - 5.6  %   Est. average glucose Bld gHb Est-mCnc 140     Assessment & Plan     1. Diabetes mellitus with no complication (HCC) Doing well with metformin. Continue current medications.   - CBC - Comprehensive metabolic panel  2. Situational stress She has stopped escitalopram since she only has trouble with this occasionally. Is worse this time of year due to passing of her daughter. Can just take prn ALPRAZolam (XANAX) 0.5 MG tablet; Take 1 tablet (0.5 mg total) by mouth every 8 (eight) hours as needed.  Dispense: 30 tablet; Refill: 3  3. Hypertriglyceridemia She is tolerating simvastatin well with no adverse effects.   - Lipid panel  4. Essential (primary) hypertension Is usually better controlled. She is going to start check BP at home an schedule early follow up with her  cardiologist if consistently above 140/90.   5. Need for hepatitis C screening test  - Hepatitis C antibody  6. Colon cancer screening  - Cologuard   Counseled that she is overdue for routine pap/pelvic/breast exam and mammogram. She prefers to see female provide.  Future Appointments  Date Time Provider Department Center  09/28/2021  5:00 PM [facility]  10/28/2021  8:40 AM Alfredia Ferguson, PA-C [facility]  03/21/2022  8:20 AM Sherrie Mustache Demetrios Isaacs, MD [facility]         The entirety of the information documented in the History of Present Illness, Review of Systems and Physical Exam were personally obtained by me. Portions of this information were initially documented by the CMA and reviewed by me for thoroughness and accuracy.     Mila Merry, MD  [facility] [phone] (phone) [phone] (fax)  [facility]

## 2021-09-20 NOTE — Patient Instructions (Signed)
Please review the attached list of medications and notify my office if there are any errors.  ° °Please bring all of your medications to every appointment so we can make sure that our medication list is the same as yours.  ° °I strongly recommend a Covid bivalent (omicron) booster if you have not yet had one  °

## 2021-09-21 LAB — LIPID PANEL
Chol/HDL Ratio: 5.8 ratio — ABNORMAL HIGH (ref 0.0–4.4)
Cholesterol, Total: 198 mg/dL (ref 100–199)
HDL: 34 mg/dL — ABNORMAL LOW (ref >39)
LDL Chol Calc (NIH): 112 mg/dL — ABNORMAL HIGH (ref 0–99)
Triglycerides: 300 mg/dL — ABNORMAL HIGH (ref 0–149)
VLDL Cholesterol Cal: 52 mg/dL — ABNORMAL HIGH (ref 5–40)

## 2021-09-21 LAB — COMPREHENSIVE METABOLIC PANEL
ALT: 10 IU/L (ref 0–32)
AST: 10 IU/L (ref 0–40)
Albumin/Globulin Ratio: 1.7 (ref 1.2–2.2)
Albumin: 4.3 g/dL (ref 3.8–4.9)
Alkaline Phosphatase: 77 IU/L (ref 44–121)
BUN/Creatinine Ratio: 17 (ref 9–23)
BUN: 10 mg/dL (ref 6–24)
Bilirubin Total: <0.2 mg/dL (ref 0.0–1.2)
CO2: 20 mmol/L (ref 20–29)
Calcium: 9.5 mg/dL (ref 8.7–10.2)
Chloride: 104 mmol/L (ref 96–106)
Creatinine, Ser: 0.58 mg/dL (ref 0.57–1.00)
Globulin, Total: 2.6 g/dL (ref 1.5–4.5)
Glucose: 159 mg/dL — ABNORMAL HIGH (ref 70–99)
Potassium: 4.8 mmol/L (ref 3.5–5.2)
Sodium: 139 mmol/L (ref 134–144)
Total Protein: 6.9 g/dL (ref 6.0–8.5)
eGFR: 109 mL/min/{1.73_m2} (ref >59)

## 2021-09-21 LAB — CBC
Hematocrit: 36.1 % (ref 34.0–46.6)
Hemoglobin: 12.5 g/dL (ref 11.1–15.9)
MCH: 30.6 pg (ref 26.6–33.0)
MCHC: 34.6 g/dL (ref 31.5–35.7)
MCV: 88 fL (ref 79–97)
Platelets: 343 10*3/uL (ref 150–450)
RBC: 4.09 x10E6/uL (ref 3.77–5.28)
RDW: 13.4 % (ref 11.7–15.4)
WBC: 6.9 10*3/uL (ref 3.4–10.8)

## 2021-09-21 LAB — HEPATITIS C ANTIBODY: Hep C Virus Ab: <0.1 s/co ratio (ref 0.0–0.9)

## 2021-09-28 ENCOUNTER — Ambulatory Visit: Payer: Medicaid Other

## 2021-10-28 ENCOUNTER — Encounter: Payer: Medicaid Other | Admitting: Physician Assistant

## 2021-10-28 NOTE — Patient Instructions (Incomplete)

## 2021-10-28 NOTE — Progress Notes (Deleted)
Complete physical exam   Patient: Gabrielle Owens   DOB: [birth year]   51 y.o. Female  MRN: [id number] Visit Date: 10/28/2021  Today's healthcare provider: Mikey Kirschner, PA-C   No chief complaint on file.  Subjective    Gabrielle Owens is a 51 y.o. female who presents today for a complete physical exam.  She reports consuming a {diet types:17450} diet. {Exercise:19826} She generally feels {well/fairly well/poorly:18703}. She reports sleeping {well/fairly well/poorly:18703}. She {does/does not:200015} have additional problems to discuss today.  HPI  -Pap smear last received 05/01/18 -Eye exam last completed 01/15/20  Past Medical History:  Diagnosis Date   ADD (attention deficit disorder) without hyperactivity    Anxiety    Cervical high risk HPV (human papillomavirus) test positive 03/10/2015   Positive 05/31/2012. Negative 06/19/2014. Normal Colposcopy. Repeat pap in 1 year per GYN.   Coronary artery disease    COVID-19    once in 2021 and once in January 2022   Diabetes mellitus without complication ([place])    Hyperlipidemia    Hypertension    No past surgical history on file. Social History   Socioeconomic History   Marital status: Married    Spouse name: Not on file   Number of children: Not on file   Years of education: Not on file   Highest education level: Not on file  Occupational History   Not on file  Tobacco Use   Smoking status: Former    Packs/day: 1.00    Years: 24.00    Pack years: 24.00    Types: Cigarettes    Quit date: 03/03/2011    Years since quitting: 10.6   Smokeless tobacco: Never  Substance and Sexual Activity   Alcohol use: Yes    Alcohol/week: 0.0 standard drinks    Comment: occasional   Drug use: No   Sexual activity: Yes  Other Topics Concern   Not on file  Social History Narrative   ** Merged History Encounter **       Social Determinants of Health   Financial Resource Strain: Not on file  Food Insecurity: Not on file   Transportation Needs: Not on file  Physical Activity: Not on file  Stress: Not on file  Social Connections: Not on file  Intimate Partner Violence: Not on file   Family Status  Relation Name Status   Mother  Alive   Father  Alive   Ethlyn Daniels  Deceased at age 23       cancer   Sister  73   Brother  Alive   MGM  Deceased at age 30       MI   MGF  Deceased at age 43       cause of death: old age   PGF  Deceased at age 17   Family History  Problem Relation Age of Onset   Hypertension Mother    Heart attack Father 10       mid 43's   Diabetes Father        type 2   Hypertension Father    COPD Father    Chronic bronchitis Father    Breast cancer Paternal Aunt    Cancer Paternal Aunt        breast   Diabetes Sister    Heart attack Maternal Grandmother    Prostate cancer Paternal Grandfather    Cancer Paternal Grandfather 69       prostate   Heart attack Paternal Grandfather  No Known Allergies  Patient Care Team: Birdie Sons, MD as PCP - General (Family Medicine) Birdie Sons, MD (Family Medicine) Pa, Chrisney (Ophthalmology)   Medications: Outpatient Medications Prior to Visit  Medication Sig   ALPRAZolam (XANAX) 0.5 MG tablet Take 1 tablet (0.5 mg total) by mouth every 8 (eight) hours as needed.   aspirin 81 MG tablet Take 81 mg by mouth daily.   escitalopram (LEXAPRO) 10 MG tablet Take 1 tablet (10 mg total) by mouth at bedtime.   hydrochlorothiazide (HYDRODIURIL) 25 MG tablet HYDROCHLOROTHIAZIDE, 25MG  (Oral Tablet)  1 every day for 0 days  Quantity: 0.00;  Refills: 0   Ordered :31-Jan-2012  Althea Charon ;  Started 31-Dec-2008 Active   losartan (COZAAR) 100 MG tablet Take 100 mg by mouth daily.   metFORMIN (GLUCOPHAGE) 1000 MG tablet TAKE 1 TABLET BY MOUTH TWICE DAILY WITH A MEAL   metoprolol (LOPRESSOR) 100 MG tablet Take 1 tablet by mouth 2 (two) times daily.   PROAIR HFA 108 (90 Base) MCG/ACT inhaler    simvastatin (ZOCOR) 20  MG tablet simvastatin 20 mg tablet   triamcinolone cream (KENALOG) 0.1 % Apply 1 application topically 2 (two) times daily.   No facility-administered medications prior to visit.    Review of Systems  {Labs   Heme   Chem   Endocrine   Serology   Results Review (optional):23779}  Objective    There were no vitals taken for this visit. {Show previous vital signs (optional):23777}  Physical Exam  ***  Last depression screening scores PHQ 2/9 Scores 09/20/2021 08/25/2020 05/01/2018  PHQ - 2 Score 3 3 6   PHQ- 9 Score 8 13 18    Last fall risk screening Fall Risk  09/20/2021  Falls in the past year? 0  Number falls in past yr: 0  Injury with Fall? 0  Risk for fall due to : No Fall Risks  Follow up Falls evaluation completed   Last Audit-C alcohol use screening Alcohol Use Disorder Test (AUDIT) 09/20/2021  1. How often do you have a drink containing alcohol? 0  2. How many drinks containing alcohol do you have on a typical day when you are drinking? 0  3. How often do you have six or more drinks on one occasion? 0  AUDIT-C Score 0  Alcohol Brief Interventions/Follow-up -   A score of 3 or more in women, and 4 or more in men indicates increased risk for alcohol abuse, EXCEPT if all of the points are from question 1   No results found for any visits on 10/28/21.  Assessment & Plan    Routine Health Maintenance and Physical Exam  Exercise Activities and Dietary recommendations  Goals   None     Immunization History  Administered Date(s) Administered   H1N1 07/30/2008   Influenza Split 07/30/2008   Influenza,inj,Quad PF,6+ Mos 07/12/2017, 08/05/2018, 08/25/2020   Pneumococcal Polysaccharide-23 07/12/2017   Tdap 05/31/2012    Health Maintenance  Topic Date Due   COVID-19 Vaccine (1) Never done   FOOT EXAM  Never done   HIV Screening  Never done   COLONOSCOPY (Pts 45-74yrs Insurance coverage will need to be confirmed)  Never done   MAMMOGRAM  Never done   Zoster  Vaccines- Shingrix (1 of 2) Never done   OPHTHALMOLOGY EXAM  01/14/2021   PAP SMEAR-Modifier  05/01/2021   INFLUENZA VACCINE  12/30/2021 (Originally 05/02/2021)   HEMOGLOBIN A1C  03/21/2022   TETANUS/TDAP  05/31/2022  Hepatitis C Screening  Completed   HPV VACCINES  Aged Out    Discussed health benefits of physical activity, and encouraged her to engage in regular exercise appropriate for her age and condition.  ***  No follow-ups on file.     {provider attestation***:1}   Mikey Kirschner, PA-C  [facility] [phone] (phone) [phone] (fax)  [place]

## 2021-11-15 ENCOUNTER — Ambulatory Visit: Payer: Medicaid Other | Admitting: Family Medicine

## 2021-11-22 ENCOUNTER — Other Ambulatory Visit: Payer: Self-pay | Admitting: Family Medicine

## 2021-11-22 DIAGNOSIS — E119 Type 2 diabetes mellitus without complications: Secondary | ICD-10-CM

## 2021-12-02 ENCOUNTER — Other Ambulatory Visit: Payer: Self-pay | Admitting: Family Medicine

## 2021-12-02 DIAGNOSIS — F419 Anxiety disorder, unspecified: Secondary | ICD-10-CM

## 2021-12-02 NOTE — Telephone Encounter (Signed)
Requested Prescriptions  ?Pending Prescriptions Disp Refills  ?? escitalopram (LEXAPRO) 10 MG tablet [Pharmacy Med Name: ESCITALOPRAM OXALATE 10 MG TAB] 90 tablet 3  ?  Sig: TAKE 1 TABLET BY MOUTH AT BEDTIME  ?  ? Psychiatry:  Antidepressants - SSRI Passed - 12/02/2021 12:15 PM  ?  ?  Passed - Valid encounter within last 6 months  ?  Recent Outpatient Visits   ?      ? 2 months ago Diabetes mellitus with no complication (HCC)  ? [facility] Sherrie Mustache, Demetrios Isaacs, MD  ? 1 year ago Exposure to COVID-19 virus  ? [facility] Sherrie Mustache, Demetrios Isaacs, MD  ? 1 year ago COVID-19  ? [facility] [place], [place], [place]  ? 1 year ago Essential (primary) hypertension  ? [facility] Sherrie Mustache, Demetrios Isaacs, MD  ? 1 year ago Diarrhea, unspecified type  ? [facility] [place], [place], [place]  ?  ?  ?Future Appointments   ?        ? In 3 months Fisher, Demetrios Isaacs, MD [facility]  ?  ? ?  ?  ?  ? ? ?

## 2021-12-15 ENCOUNTER — Other Ambulatory Visit: Payer: Self-pay | Admitting: Family Medicine

## 2021-12-15 DIAGNOSIS — F439 Reaction to severe stress, unspecified: Secondary | ICD-10-CM

## 2021-12-15 NOTE — Telephone Encounter (Signed)
Requested medication (s) are due for refill today: yes ? ?Requested medication (s) are on the active medication list: yes ? ?Last refill:  09/20/21 #30/3 ? ?Future visit scheduled: yes ? ?Notes to clinic:  Unable to refill per protocol, cannot delegate. ? ?  ?Requested Prescriptions  ?Pending Prescriptions Disp Refills  ? ALPRAZolam (XANAX) 0.5 MG tablet [Pharmacy Med Name: ALPRAZOLAM 0.5 MG TAB] 30 tablet   ?  Sig: TAKE 1 TABLET BY MOUTH EVERY 8 HOURS AS NEEDED  ?  ? Not Delegated - Psychiatry: Anxiolytics/Hypnotics 2 Failed - 12/15/2021 11:57 AM  ?  ?  Failed - This refill cannot be delegated  ?  ?  Failed - Urine Drug Screen completed in last 360 days  ?  ?  Passed - Patient is not pregnant  ?  ?  Passed - Valid encounter within last 6 months  ?  Recent Outpatient Visits   ? ?      ? 2 months ago Diabetes mellitus with no complication (HCC)  ? [facility] Sherrie Mustache, Demetrios Isaacs, MD  ? 1 year ago Exposure to COVID-19 virus  ? [facility] Sherrie Mustache, Demetrios Isaacs, MD  ? 1 year ago COVID-19  ? [facility] [place], [place], [place]  ? 1 year ago Essential (primary) hypertension  ? [facility] Sherrie Mustache, Demetrios Isaacs, MD  ? 1 year ago Diarrhea, unspecified type  ? [facility] [place], [place], [place]  ? ?  ?  ?Future Appointments   ? ?        ? In 3 months Fisher, Demetrios Isaacs, MD [facility]  ? ?  ? ?  ?  ?  ? ?

## 2021-12-16 NOTE — Telephone Encounter (Signed)
done

## 2021-12-23 ENCOUNTER — Encounter: Payer: Self-pay | Admitting: Physician Assistant

## 2021-12-23 ENCOUNTER — Other Ambulatory Visit: Payer: Self-pay

## 2021-12-23 ENCOUNTER — Ambulatory Visit
Admission: RE | Admit: 2021-12-23 | Discharge: 2021-12-23 | Disposition: A | Discharge status: Home / Self Care | Payer: Medicaid Other | Attending: Physician Assistant | Admitting: Physician Assistant

## 2021-12-23 ENCOUNTER — Ambulatory Visit
Admission: RE | Admit: 2021-12-23 | Discharge: 2021-12-23 | Disposition: A | Discharge status: Home / Self Care | Payer: Medicaid Other | Source: Ambulatory Visit | Attending: Physician Assistant | Admitting: Physician Assistant

## 2021-12-23 ENCOUNTER — Ambulatory Visit (INDEPENDENT_AMBULATORY_CARE_PROVIDER_SITE_OTHER): Payer: Medicaid Other | Admitting: Physician Assistant

## 2021-12-23 VITALS — BP 144/99 | HR 83 | Ht 62.0 in | Wt 193.1 lb

## 2021-12-23 DIAGNOSIS — Z1231 Encounter for screening mammogram for malignant neoplasm of breast: Secondary | ICD-10-CM

## 2021-12-23 DIAGNOSIS — M5441 Lumbago with sciatica, right side: Secondary | ICD-10-CM | POA: Diagnosis present

## 2021-12-23 DIAGNOSIS — Z1211 Encounter for screening for malignant neoplasm of colon: Secondary | ICD-10-CM | POA: Diagnosis not present

## 2021-12-23 MED ORDER — PREDNISONE 10 MG PO TABS: 10.0000 mg | ORAL_TABLET | Freq: Every day | ORAL | 0 refills | Status: AC

## 2021-12-23 MED ORDER — TRAMADOL HCL 50 MG PO TABS: 50.0000 mg | ORAL_TABLET | Freq: Three times a day (TID) | ORAL | 0 refills | Status: DC | PRN

## 2021-12-23 NOTE — Progress Notes (Signed)
?I,Corrigan Kretschmer,acting as a scribe for [employer], PA-C.,have documented all relevant documentation on the behalf of Mikey Kirschner, PA-C,as directed by  Mikey Kirschner, PA-C while in the presence of Mikey Kirschner, PA-C. ? ?Established Patient Office Visit ? ?Subjective:  ?Patient ID: Gabrielle Owens, female    DOB: [birth year]  Age: 51 y.o. MRN: [id number] ? ?CC: Right buttock pain x 4 weeks ? ?Gabrielle Owens is a 51 y/o female who presents with progressing right buttock pain that she describes as a burning/stabbing. She reports the pain has been progressing over the past 4 weeks. Sh has a history of a herniated low back disc but has never experienced this pain before. She has pain when bending over, sleeping. Denies paresthesias, but reports 'right leg can feel heavy'. Denies dizziness, headaches, n/v. Denies injury or trauma. She has been taking 4 ibuprofen q 6 hours which has not managed the pain.  ? ?Past Medical History:  ?Diagnosis Date  ? ADD (attention deficit disorder) without hyperactivity   ? Anxiety   ? Cervical high risk HPV (human papillomavirus) test positive 03/10/2015  ? Positive 05/31/2012. Negative 06/19/2014. Normal Colposcopy. Repeat pap in 1 year per GYN.  ? Coronary artery disease   ? COVID-19   ? once in 2021 and once in January 2022  ? Diabetes mellitus without complication ([place])   ? Hyperlipidemia   ? Hypertension   ? ? ?History reviewed. No pertinent surgical history. ? ?Family History  ?Problem Relation Age of Onset  ? Hypertension Mother   ? Heart attack Father 89  ?     mid 17's  ? Diabetes Father   ?     type 2  ? Hypertension Father   ? COPD Father   ? Chronic bronchitis Father   ? Breast cancer Paternal Aunt   ? Cancer Paternal Aunt   ?     breast  ? Diabetes Sister   ? Heart attack Maternal Grandmother   ? Prostate cancer Paternal Grandfather   ? Cancer Paternal Grandfather 79  ?     prostate  ? Heart attack Paternal Grandfather   ? ? ?Social History  ? ?Socioeconomic History  ? Marital  status: Married  ?  Spouse name: Not on file  ? Number of children: Not on file  ? Years of education: Not on file  ? Highest education level: Not on file  ?Occupational History  ? Not on file  ?Tobacco Use  ? Smoking status: Former  ?  Packs/day: 1.00  ?  Years: 24.00  ?  Pack years: 24.00  ?  Types: Cigarettes  ?  Quit date: 03/03/2011  ?  Years since quitting: 10.8  ? Smokeless tobacco: Never  ?Substance and Sexual Activity  ? Alcohol use: Yes  ?  Alcohol/week: 0.0 standard drinks  ?  Comment: occasional  ? Drug use: No  ? Sexual activity: Yes  ?Other Topics Concern  ? Not on file  ?Social History Narrative  ? ** Merged History Encounter **  ?    ? ?Social Determinants of Health  ? ?Financial Resource Strain: Not on file  ?Food Insecurity: Not on file  ?Transportation Needs: Not on file  ?Physical Activity: Not on file  ?Stress: Not on file  ?Social Connections: Not on file  ?Intimate Partner Violence: Not on file  ? ? ?Outpatient Medications Prior to Visit  ?Medication Sig Dispense Refill  ? ALPRAZolam (XANAX) 0.5 MG tablet TAKE 1 TABLET BY MOUTH EVERY 8 HOURS AS NEEDED  30 tablet 3  ? aspirin 81 MG tablet Take 81 mg by mouth daily.    ? escitalopram (LEXAPRO) 10 MG tablet TAKE 1 TABLET BY MOUTH AT BEDTIME 90 tablet 1  ? hydrochlorothiazide (HYDRODIURIL) 25 MG tablet HYDROCHLOROTHIAZIDE, 25MG (Oral Tablet) ? 1 every day for 0 days ? Quantity: 0.00;  Refills: 0 ? ? Ordered :31-Jan-2012 ? Althea Charon ;  Started 31-Dec-2008 ?Active    ? losartan (COZAAR) 100 MG tablet Take 100 mg by mouth daily.    ? metFORMIN (GLUCOPHAGE) 1000 MG tablet TAKE ONE (1) TABLET BY MOUTH TWO TIMES PER DAY WITH A MEAL 180 tablet 1  ? metoprolol (LOPRESSOR) 100 MG tablet Take 1 tablet by mouth 2 (two) times daily.    ? PROAIR HFA 108 (90 Base) MCG/ACT inhaler     ? simvastatin (ZOCOR) 20 MG tablet simvastatin 20 mg tablet    ? triamcinolone cream (KENALOG) 0.1 % Apply 1 application topically 2 (two) times daily. 30 g 0  ? ?No  facility-administered medications prior to visit.  ? ? ?No Known Allergies ? ?ROS ?Review of Systems  ?Constitutional:  Negative for fatigue and fever.  ?Respiratory:  Negative for cough and shortness of breath.   ?Cardiovascular:  Negative for chest pain and leg swelling.  ?Gastrointestinal:  Negative for abdominal pain.  ?Genitourinary:  Negative for difficulty urinating and dysuria.  ?Musculoskeletal:  Positive for back pain and gait problem.  ?Neurological:  Positive for weakness. Negative for dizziness and headaches.  ? ?  ?Objective:  ?  ?Physical Exam ?Constitutional:   ?   Appearance: Normal appearance. She is not ill-appearing.  ?HENT:  ?   Head: Normocephalic.  ?Eyes:  ?   Conjunctiva/sclera: Conjunctivae normal.  ?Cardiovascular:  ?   Rate and Rhythm: Normal rate and regular rhythm.  ?   Pulses: Normal pulses.  ?   Heart sounds: Normal heart sounds.  ?Pulmonary:  ?   Effort: Pulmonary effort is normal.  ?   Breath sounds: Normal breath sounds.  ?Musculoskeletal:  ?   Comments: + Right straight leg raise.  ?Tenderness across R buttock  ?Heavily restricted ROM right hip/leg secondary to pain  ?Neurological:  ?   Mental Status: She is oriented to person, place, and time.  ?Psychiatric:     ?   Mood and Affect: Mood normal.     ?   Behavior: Behavior normal.  ? ? ?BP (!) 144/99 (BP Location: Right Arm, Patient Position: Sitting, Cuff Size: Normal)   Pulse 83   Ht '5\' 2"'  (1.575 m)   Wt 193 lb 1.6 oz (87.6 kg)   LMP 12/05/2021 (Approximate)   SpO2 97%   BMI 35.32 kg/m?  ?Wt Readings from Last 3 Encounters:  ?12/23/21 193 lb 1.6 oz (87.6 kg)  ?09/20/21 189 lb (85.7 kg)  ?11/01/20 193 lb 9.6 oz (87.8 kg)  ? ? ? ?Health Maintenance Due  ?Topic Date Due  ? COVID-19 Vaccine (1) Never done  ? FOOT EXAM  Never done  ? HIV Screening  Never done  ? COLONOSCOPY (Pts 45-72yr Insurance coverage will need to be confirmed)  Never done  ? MAMMOGRAM  Never done  ? Zoster Vaccines- Shingrix (1 of 2) Never done  ?  OPHTHALMOLOGY EXAM  01/14/2021  ? PAP SMEAR-Modifier  05/01/2021  ? ? ?There are no preventive care reminders to display for this patient. ? ?Lab Results  ?Component Value Date  ? TSH 1.700 05/01/2018  ? ?Lab Results  ?Component  Value Date  ? WBC 6.9 09/20/2021  ? HGB 12.5 09/20/2021  ? HCT 36.1 09/20/2021  ? MCV 88 09/20/2021  ? PLT 343 09/20/2021  ? ?Lab Results  ?Component Value Date  ? NA 139 09/20/2021  ? K 4.8 09/20/2021  ? CO2 20 09/20/2021  ? GLUCOSE 159 (H) 09/20/2021  ? BUN 10 09/20/2021  ? CREATININE 0.58 09/20/2021  ? BILITOT <0.2 09/20/2021  ? ALKPHOS 77 09/20/2021  ? AST 10 09/20/2021  ? ALT 10 09/20/2021  ? PROT 6.9 09/20/2021  ? ALBUMIN 4.3 09/20/2021  ? CALCIUM 9.5 09/20/2021  ? ANIONGAP 12 11/21/2012  ? EGFR 109 09/20/2021  ? ?Lab Results  ?Component Value Date  ? CHOL 198 09/20/2021  ? ?Lab Results  ?Component Value Date  ? HDL 34 (L) 09/20/2021  ? ?Lab Results  ?Component Value Date  ? LDLCALC 112 (H) 09/20/2021  ? ?Lab Results  ?Component Value Date  ? TRIG 300 (H) 09/20/2021  ? ?Lab Results  ?Component Value Date  ? CHOLHDL 5.8 (H) 09/20/2021  ? ?Lab Results  ?Component Value Date  ? HGBA1C 6.5 (A) 09/20/2021  ? ? ?  ?Assessment & Plan:  ? ?Problem List Items Addressed This Visit   ? ?  ? Nervous and Auditory  ? Acute right-sided low back pain with right-sided sciatica - Primary  ?  Lumbar / pelvic/right hip xrays ordered. ?rx prednisone 10 mg in AM x 7 days ?rx tramadol 50 mg q 8 hrs x 5 days ? ?Reviewed ED protocol of groin numbness/ falls / right leg numbness ?  ?  ? Relevant Medications  ? predniSONE (DELTASONE) 10 MG tablet  ? traMADol (ULTRAM) 50 MG tablet  ? Other Relevant Orders  ? DG Lumbar Spine Complete  ? DG Hip Unilat W OR W/O Pelvis 2-3 Views Right  ? ?Other Visit Diagnoses   ? ? Encounter for screening mammogram for breast cancer      ? Relevant Orders  ? MM 3D SCREEN BREAST BILATERAL  ? Encounter for screening for malignant neoplasm of colon      ? Relevant Orders  ? Ambulatory  referral to Gastroenterology  ? ?  ? ? ?Return if symptoms worsen or fail to improve. ? ?I, Mikey Kirschner, PA-C have reviewed all documentation for this visit. The documentation on  12/23/2021 for the exam, diagnosis, procedures,

## 2021-12-26 ENCOUNTER — Encounter: Payer: Self-pay | Admitting: Physician Assistant

## 2021-12-26 ENCOUNTER — Other Ambulatory Visit: Payer: Self-pay

## 2021-12-26 DIAGNOSIS — M5441 Lumbago with sciatica, right side: Secondary | ICD-10-CM | POA: Insufficient documentation

## 2021-12-26 DIAGNOSIS — Z1211 Encounter for screening for malignant neoplasm of colon: Secondary | ICD-10-CM

## 2021-12-26 MED ORDER — NA SULFATE-K SULFATE-MG SULF 17.5-3.13-1.6 GM/177ML PO SOLN: 1.0000 | Freq: Once | ORAL | 0 refills | Status: AC

## 2021-12-26 NOTE — Assessment & Plan Note (Addendum)
Lumbar / pelvic/right hip xrays ordered. ?rx prednisone 10 mg in AM x 7 days ?rx tramadol 50 mg q 8 hrs x 5 days ? ?Reviewed ED protocol of groin numbness/ falls / right leg numbness ?

## 2021-12-26 NOTE — Progress Notes (Signed)
Gastroenterology Pre-Procedure Review ? ?Request Date: 02/03/2022 ?Requesting Physician: Dr. Vicente Males ? ?PATIENT REVIEW QUESTIONS: The patient responded to the following health history questions as indicated:   ? ?1. Are you having any GI issues? yes (IBS) ?2. Do you have a personal history of Polyps? no ?3. Do you have a family history of Colon Cancer or Polyps? yes (POLYPS) ?4. Diabetes Mellitus? yes (TYPE 2) ?5. Joint replacements in the past 12 months?no ?6. Major health problems in the past 3 months?no ?7. Any artificial heart valves, MVP, or defibrillator?no ?   ?MEDICATIONS & ALLERGIES:    ?Patient reports the following regarding taking any anticoagulation/antiplatelet therapy:   ?Plavix, Coumadin, Eliquis, Xarelto, Lovenox, Pradaxa, Brilinta, or Effient? no ?Aspirin? yes (81 MG) ? ?Patient confirms/reports the following medications:  ?Current Outpatient Medications  ?Medication Sig Dispense Refill  ? ALPRAZolam (XANAX) 0.5 MG tablet TAKE 1 TABLET BY MOUTH EVERY 8 HOURS AS NEEDED 30 tablet 3  ? aspirin 81 MG tablet Take 81 mg by mouth daily.    ? escitalopram (LEXAPRO) 10 MG tablet TAKE 1 TABLET BY MOUTH AT BEDTIME 90 tablet 1  ? hydrochlorothiazide (HYDRODIURIL) 25 MG tablet HYDROCHLOROTHIAZIDE, 25MG  (Oral Tablet) ? 1 every day for 0 days ? Quantity: 0.00;  Refills: 0 ? ? Ordered :31-Jan-2012 ? Althea Charon ;  Started 31-Dec-2008 ?Active    ? losartan (COZAAR) 100 MG tablet Take 100 mg by mouth daily.    ? metFORMIN (GLUCOPHAGE) 1000 MG tablet TAKE ONE (1) TABLET BY MOUTH TWO TIMES PER DAY WITH A MEAL 180 tablet 1  ? metoprolol (LOPRESSOR) 100 MG tablet Take 1 tablet by mouth 2 (two) times daily.    ? predniSONE (DELTASONE) 10 MG tablet Take 1 tablet (10 mg total) by mouth daily with breakfast for 7 days. 7 tablet 0  ? PROAIR HFA 108 (90 Base) MCG/ACT inhaler     ? simvastatin (ZOCOR) 20 MG tablet simvastatin 20 mg tablet    ? traMADol (ULTRAM) 50 MG tablet Take 1 tablet (50 mg total) by mouth every 8 (eight)  hours as needed for up to 5 days. 15 tablet 0  ? ?No current facility-administered medications for this visit.  ? ? ?Patient confirms/reports the following allergies:  ?No Known Allergies ? ?No orders of the defined types were placed in this encounter. ? ? ?AUTHORIZATION INFORMATION ?Primary Insurance: ?1D#: ?Group #: ? ?Secondary Insurance: ?1D#: ?Group #: ? ?SCHEDULE INFORMATION: ?Date: 02/03/2022 ?Time: ?[place] ? ?

## 2021-12-27 ENCOUNTER — Encounter: Payer: Self-pay | Admitting: Physician Assistant

## 2021-12-27 ENCOUNTER — Telehealth: Payer: Self-pay

## 2021-12-27 NOTE — Telephone Encounter (Signed)
Copied from [place] [id number]. Topic: General - Other ?>> Dec 27, 2021  8:49 AM Alanda Slim E wrote: ?Reason for CRM: Pt was calling to let Ria Comment know she took out of work Monday and today and if she can get a note for work placed in her [facility] Ping / pt states she still is in pain but not as bad as it was Friday / please advise ?

## 2021-12-29 ENCOUNTER — Ambulatory Visit: Payer: Self-pay

## 2021-12-29 NOTE — Telephone Encounter (Signed)
Pt stated was given the medication predniSONE (DELTASONE) 10 MG tablet and was advised by Alfredia Ferguson, to reach out immediately if her BS levels were elevated. Pt stated yesterday that she felt nauseated and that she vomited. Pt stated she is feeling better today; her BS level this morning were 147 and last night it was 121.  ? ?After she ate this morning, BS was 187.  ? ?Felt nauseated when drinking water.  ? ?Seeking clinical advise.  ? ? ? ?Chief Complaint: Started Prednisone this week. Has had mild nausea. Blood sugar has been mildly elevated. Has helped her sciatica.  ?Symptoms: Above ?Frequency: This week ?Pertinent Negatives: Patient denies  ?Disposition: [] ED /[] Urgent Care (no appt availability in office) / [] Appointment(In office/virtual)/ []  [facility]/ [] Home Care/ [] Refused Recommended Disposition /[] [facility]/ []  Follow-up with PCP ?Additional Notes: Has one more day of Prednisone. Wanted PCP to be aware of side effects. Please advise.  ?Answer Assessment - Initial Assessment Questions ?1. NAME of MEDICATION: "What medicine are you calling about?" ?    Prednisone ?2. QUESTION: "What is your question?" (e.g., double dose of medicine, side effect) ?    Side effect - nausea, blood sugar up. After breakfast today 187. ?3. PRESCRIBING HCP: "Who prescribed it?" Reason: if prescribed by specialist, call should be referred to that group. ?    [facility] ?4. SYMPTOMS: "Do you have any symptoms?" ?    Mild nausea, blood sugar ?5. SEVERITY: If symptoms are present, ask "Are they mild, moderate or severe?" ?    Mild ?6. PREGNANCY:  "Is there any chance that you are pregnant?" "When was your last menstrual period?" ?    No ? ?Protocols used: Medication Question Call-A-AH ? ?

## 2022-01-02 ENCOUNTER — Encounter: Payer: Self-pay | Admitting: *Deleted

## 2022-01-02 ENCOUNTER — Ambulatory Visit: Payer: Self-pay | Admitting: *Deleted

## 2022-01-02 ENCOUNTER — Encounter: Payer: Self-pay | Admitting: Family Medicine

## 2022-01-02 ENCOUNTER — Ambulatory Visit (INDEPENDENT_AMBULATORY_CARE_PROVIDER_SITE_OTHER): Payer: Medicaid Other | Admitting: Family Medicine

## 2022-01-02 VITALS — BP 149/92 | HR 80 | Temp 98.1°F | Resp 16 | Wt 189.0 lb

## 2022-01-02 DIAGNOSIS — E119 Type 2 diabetes mellitus without complications: Secondary | ICD-10-CM

## 2022-01-02 DIAGNOSIS — M5441 Lumbago with sciatica, right side: Secondary | ICD-10-CM

## 2022-01-02 MED ORDER — PREDNISONE 10 MG PO TABS: ORAL_TABLET | ORAL | 0 refills | Status: AC

## 2022-01-02 MED ORDER — GLIPIZIDE 5 MG PO TABS: 5.0000 mg | ORAL_TABLET | Freq: Every day | ORAL | 0 refills | Status: DC

## 2022-01-02 MED ORDER — TRAMADOL HCL 50 MG PO TABS: 50.0000 mg | ORAL_TABLET | Freq: Three times a day (TID) | ORAL | 0 refills | Status: AC | PRN

## 2022-01-02 NOTE — Progress Notes (Signed)
?  ? ? ?Established patient visit ? ?I,April Miller,acting as a scribe for Mila Merry, MD.,have documented all relevant documentation on the behalf of Mila Merry, MD,as directed by  Mila Merry, MD while in the presence of Mila Merry, MD. ? ? ?Patient: Gabrielle Owens   DOB: 1971-01-16   51 y.o. Female  MRN: [id number] ?Visit Date: 01/02/2022 ? ?Today's healthcare provider: Mila Merry, MD  ? ?Chief Complaint  ?Patient presents with  ? Follow-up  ? Back Pain  ? ?Subjective  ?  ?HPI  ?Follow up for Acute right-sided low back pain with right-sided sciatica - Primary ? ?The patient was last seen for this 10 days ago. ?Changes made at last visit include; seen by Arvella Nigh. X-rays obtained. ? ?"Mild straightening expected lumbar lordosis. Otherwise, no acute ?findings. ?Mild to moderate multilevel lumbar spine DDD, worse at L5-S1 and to ?a lesser extent, L3-L4 and L4-L5 with disc space height loss, ?endplate irregularity and sclerosis, progressed compared to the 2016 ?Examination" ? ?She does have know history herniated lumbar disk from MRI of 2009 ?Prescribed 6 day course of prednisone and 15 tramadol ? ?She reports good compliance with treatment. ?She feels that condition is Unchanged. She did improve significantly while taking prednisone, but pain started flaring back up with a few days. Tramadol was also effective but is out now  . She did take ibuprofen which provided slight improvement.  ?She is not having side effects. none ? ?----------------------------------------------------------------------------------------- ? ?Patient completed prednisone and tramadol but still having pain. ? ?Medications: ?Outpatient Medications Prior to Visit  ?Medication Sig  ? ALPRAZolam (XANAX) 0.5 MG tablet TAKE 1 TABLET BY MOUTH EVERY 8 HOURS AS NEEDED  ? aspirin 81 MG tablet Take 81 mg by mouth daily.  ? escitalopram (LEXAPRO) 10 MG tablet TAKE 1 TABLET BY MOUTH AT BEDTIME  ? hydrochlorothiazide (HYDRODIURIL) 25 MG  tablet HYDROCHLOROTHIAZIDE, 25MG  (Oral Tablet) ? 1 every day for 0 days ? Quantity: 0.00;  Refills: 0 ? ? Ordered :31-Jan-2012 ? 06-25-1995 ;  Started 31-Dec-2008 ?Active  ? losartan (COZAAR) 100 MG tablet Take 100 mg by mouth daily.  ? metFORMIN (GLUCOPHAGE) 1000 MG tablet TAKE ONE (1) TABLET BY MOUTH TWO TIMES PER DAY WITH A MEAL  ? metoprolol (LOPRESSOR) 100 MG tablet Take 1 tablet by mouth 2 (two) times daily.  ? nitroGLYCERIN (NITROSTAT) 0.4 MG SL tablet Place under the tongue.  ? PROAIR HFA 108 (90 Base) MCG/ACT inhaler   ? simvastatin (ZOCOR) 20 MG tablet simvastatin 20 mg tablet  ? tiZANidine (ZANAFLEX) 4 MG tablet Take by mouth.  ? ?No facility-administered medications prior to visit.  ? ? ?Review of Systems  ?Constitutional:  Negative for appetite change, chills, fatigue and fever.  ?Respiratory:  Negative for chest tightness and shortness of breath.   ?Cardiovascular:  Negative for chest pain and palpitations.  ?Gastrointestinal:  Negative for abdominal pain, nausea and vomiting.  ?Neurological:  Negative for dizziness and weakness.  ? ? ?  Objective  ?  ?BP (!) 149/92 (BP Location: Left Arm, Patient Position: Sitting, Cuff Size: Normal)   Pulse 80   Temp 98.1 ?F (36.7 ?C) (Temporal)   Resp 16   Wt 189 lb (85.7 kg)   LMP 12/05/2021 (Approximate)   SpO2 98%   BMI 34.57 kg/m?  ? ? ?Physical Exam  ?General appearance: Mildly obese female, cooperative and in no acute distress ?Head: Normocephalic, without obvious abnormality, atraumatic ?Respiratory: Respirations even and unlabored, normal respiratory rate ?Extremities: All extremities  are intact.  ?Skin: Skin color, texture, turgor normal. No rashes seen  ?Psych: Appropriate mood and affect. ?Neurologic: Mental status: Alert, oriented to person, place, and time, thought content appropriate.  ? Assessment & Plan  ?  ? ?1. Acute right-sided low back pain with right-sided sciatica ?Significant improvement on 6 day prednisone, will repeat with 12 days  course.  ?- predniSONE (DELTASONE) 10 MG tablet; 6 tablets for 2 days, then 5 for 2 days, then 4 for 2 days, then 3 for 2 days, then 2 for 2 days, then 1 for 2 days.  Dispense: 42 tablet; Refill: 0 ?- traMADol (ULTRAM) 50 MG tablet; Take 1 tablet (50 mg total) by mouth every 8 (eight) hours as needed for up to 5 days.  Dispense: 15 tablet; Refill: 0 ? ?Consider repeat MRI if sx return after extended course of prednisone.  ? ?2. Diabetes mellitus with no complication (H[facility]) ?Sugars typically increase when she takes prednisone. Will compensate with  glipiZIDE (GLUCOTROL) 5 MG tablet; Take 1 tablet (5 mg total) by mouth daily before breakfast for 14 days.  Dispense: 14 tablet; Refill: 0 ? ?   ? ?The entirety of the information documented in the History of Present Illness, Review of Systems and Physical Exam were personally obtained by me. Portions of this information were initially documented by the CMA and reviewed by me for thoroughness and accuracy.   ? ? ?Mila Merry, MD  ?[facility] ?[phone] (phone) ?[phone] (fax) ? ?[facility] ?

## 2022-01-02 NOTE — Telephone Encounter (Signed)
?  Chief Complaint: BAck pain ?Symptoms: back pain, radiates down right leg, 8/10 ?Frequency: completed course of prednisone Friday. Told to call for appt if persists. ?Pertinent Negatives: Patient denies numbness ?Disposition: [] ED /[] Urgent Care (no appt availability in office) / [x] Appointment(In office/virtual)/ []  [facility]/ [] Home Care/ [] Refused Recommended Disposition /[] [facility]/ []  Follow-up with PCP ?Additional Notes: Appt secured for today at 4:20. Care advise given, pt verbalizes understanding ?Reason for Disposition ? Numbness in a leg or foot (i.e., loss of sensation) ?   No numbness, pt seen for issue, completed course of prednisone. ? ?Answer Assessment - Initial Assessment Questions ?1. ONSET: "When did the pain begin?"  ?    1 week ago ?2. LOCATION: "Where does it hurt?" (upper, mid or lower back) ?    Radiates down right leg ?3. SEVERITY: "How bad is the pain?"  (e.g., Scale 1-10; mild, moderate, or severe) ?  - MILD (1-3): doesn't interfere with normal activities  ?  - MODERATE (4-7): interferes with normal activities or awakens from sleep  ?  - SEVERE (8-10): excruciating pain, unable to do any normal activities  ?    8/10 ?4. PATTERN: "Is the pain constant?" (e.g., yes, no; constant, intermittent)  ?    constant ?5. RADIATION: "Does the pain shoot into your legs or elsewhere?" ?    Right leg ?6. CAUSE:  "What do you think is causing the back pain?"  ?    Sciatica. ?7. BACK OVERUSE:  "Any recent lifting of heavy objects, strenuous work or exercise?" ?     ?8. MEDICATIONS: "What have you taken so far for the pain?" (e.g., nothing, acetaminophen, NSAIDS) ?    Completed prednisone Friday. ?9. NEUROLOGIC SYMPTOMS: "Do you have any weakness, numbness, or problems with bowel/bladder control?" ?    No ?10. OTHER SYMPTOMS: "Do you have any other symptoms?" (e.g., fever, abdominal pain, burning with urination, blood in urine) ?      no ? ?Protocols used: Back Pain-A-AH ? ?

## 2022-01-16 ENCOUNTER — Encounter: Payer: Self-pay | Admitting: Family Medicine

## 2022-01-16 ENCOUNTER — Other Ambulatory Visit: Payer: Self-pay | Admitting: Family Medicine

## 2022-01-16 ENCOUNTER — Ambulatory Visit (INDEPENDENT_AMBULATORY_CARE_PROVIDER_SITE_OTHER): Payer: Medicaid Other | Admitting: Family Medicine

## 2022-01-16 VITALS — BP 124/76 | HR 86 | Temp 98.6°F | Resp 16 | Wt 194.0 lb

## 2022-01-16 DIAGNOSIS — M519 Unspecified thoracic, thoracolumbar and lumbosacral intervertebral disc disorder: Secondary | ICD-10-CM | POA: Diagnosis not present

## 2022-01-16 DIAGNOSIS — M5441 Lumbago with sciatica, right side: Secondary | ICD-10-CM

## 2022-01-16 DIAGNOSIS — E119 Type 2 diabetes mellitus without complications: Secondary | ICD-10-CM | POA: Diagnosis not present

## 2022-01-16 LAB — POCT URINALYSIS DIPSTICK
Bilirubin, UA: NEGATIVE
Blood, UA: NEGATIVE
Glucose, UA: POSITIVE — AB
Ketones, UA: NEGATIVE
Leukocytes, UA: NEGATIVE
Nitrite, UA: NEGATIVE
Protein, UA: NEGATIVE
Spec Grav, UA: 1.015 (ref 1.010–1.025)
Urobilinogen, UA: 0.2 E.U./dL
pH, UA: 6 (ref 5.0–8.0)

## 2022-01-16 MED ORDER — TRAMADOL HCL 50 MG PO TABS: 50.0000 mg | ORAL_TABLET | Freq: Three times a day (TID) | ORAL | 1 refills | Status: DC | PRN

## 2022-01-16 NOTE — Progress Notes (Signed)
?  ? ? ?I,Roshena L Chambers,acting as a scribe for Mila Merry, MD.,have documented all relevant documentation on the behalf of Mila Merry, MD,as directed by  Mila Merry, MD while in the presence of Mila Merry, MD.  ? ? ?Established patient visit ? ? ?Patient: Gabrielle Owens   DOB: Jun 28, 1971   51 y.o. Female  MRN: [id number] ?Visit Date: 01/16/2022 ? ?Today's healthcare provider: Mila Merry, MD  ? ?Chief Complaint  ?Patient presents with  ? Back Pain  ? ?Subjective  ?  ?Follow up for low back pain with right-sided sciatica.   ? ?The patient was last seen for this 2 weeks ago and in March.  ?Changes made at last visit include prednisone 10 MG tablet; 6 tablets for 2 days, then 5 for 2 days, then 4 for 2 days, then 3 for 2 days, then 2 for 2 days, then 1 for 2 days.  Dispense: 42 tablet; Refill: 0 ?- traMADol (ULTRAM) 50 MG tablet; Take 1 tablet (50 mg total) by mouth every 8 (eight) hours as needed for up to 5 days. ? ?She reports good compliance with treatment. ?She feels that condition is slightly Improved. Patient reports she is able to walk without pain, but she still has pain moving in certain directions. ?She is not having side effects.  ? ?-----------------------------------------------------------------------------------------  ? ?---------------------------------------------------------------------------------------------------  ? ?Medications: ?Outpatient Medications Prior to Visit  ?Medication Sig  ? ALPRAZolam (XANAX) 0.5 MG tablet TAKE 1 TABLET BY MOUTH EVERY 8 HOURS AS NEEDED  ? aspirin 81 MG tablet Take 81 mg by mouth daily.  ? escitalopram (LEXAPRO) 10 MG tablet TAKE 1 TABLET BY MOUTH AT BEDTIME  ? glipiZIDE (GLUCOTROL) 5 MG tablet Take 1 tablet (5 mg total) by mouth daily before breakfast for 14 days.  ? hydrochlorothiazide (HYDRODIURIL) 25 MG tablet HYDROCHLOROTHIAZIDE, 25MG  (Oral Tablet) ? 1 every day for 0 days ? Quantity: 0.00;  Refills: 0 ? ? Ordered :31-Jan-2012 ? 06-25-1995 ;   Started 31-Dec-2008 ?Active  ? losartan (COZAAR) 100 MG tablet Take 100 mg by mouth daily.  ? metFORMIN (GLUCOPHAGE) 1000 MG tablet TAKE ONE (1) TABLET BY MOUTH TWO TIMES PER DAY WITH A MEAL  ? metoprolol (LOPRESSOR) 100 MG tablet Take 1 tablet by mouth 2 (two) times daily.  ? PROAIR HFA 108 (90 Base) MCG/ACT inhaler   ? simvastatin (ZOCOR) 20 MG tablet simvastatin 20 mg tablet  ? tiZANidine (ZANAFLEX) 4 MG tablet Take by mouth.  ? nitroGLYCERIN (NITROSTAT) 0.4 MG SL tablet Place under the tongue.  ? ?No facility-administered medications prior to visit.  ? ? ?Review of Systems  ?Constitutional:  Negative for appetite change, chills, fatigue and fever.  ?Respiratory:  Negative for chest tightness and shortness of breath.   ?Cardiovascular:  Negative for chest pain and palpitations.  ?Gastrointestinal:  Negative for abdominal pain, nausea and vomiting.  ?Musculoskeletal:  Positive for back pain.  ?Neurological:  Negative for dizziness and weakness.  ? ? ?  Objective  ?  ?BP 124/76 (BP Location: Left Arm, Patient Position: Sitting, Cuff Size: Large)   Pulse 86   Temp 98.6 ?F (37 ?C) (Oral)   Resp 16   Wt 194 lb (88 kg)   LMP  (Within Months) Comment: 08/2021  SpO2 100%   BMI 35.48 kg/m?  ? ? ?Physical Exam  ? ?General appearance: Obese female, cooperative and in no acute distress ?Head: Normocephalic, without obvious abnormality, atraumatic ?Respiratory: Respirations even and unlabored, normal respiratory rate ?Extremities: All extremities  are intact.  ?Skin: Skin color, texture, turgor normal. No rashes seen  ?Psych: Appropriate mood and affect. ?Neurologic: Mental status: Alert, oriented to person, place, and time, thought content appropriate.   ? ?Results for orders placed or performed in visit on 01/16/22  ?POCT Urinalysis Dipstick  ?Result Value Ref Range  ? Color, UA yellow   ? Clarity, UA clear   ? Glucose, UA Positive (A) Negative  ? Bilirubin, UA negative   ? Ketones, UA negative   ? Spec Grav, UA 1.015  1.010 - 1.025  ? Blood, UA negative   ? pH, UA 6.0 5.0 - 8.0  ? Protein, UA Negative Negative  ? Urobilinogen, UA 0.2 0.2 or 1.0 E.U./dL  ? Nitrite, UA negative   ? Leukocytes, UA Negative Negative  ? Appearance    ? Odor    ?  ? Assessment & Plan  ?  ? ?1. Lumbar disc disease ?Ongoing over the last few months failed multiple courses of prednisone and pain medications.  ?- MR Lumbar Spine Wo Contrast; Future ?- Ambulatory referral to Orthopedic Surgery ? ?2. Acute right-sided low back pain with right-sided sciatica ? ?- MR Lumbar Spine Wo Contrast; Future ?- Ambulatory referral to Orthopedic Surgery ?- traMADol (ULTRAM) 50 MG tablet; Take 1 tablet (50 mg total) by mouth every 8 (eight) hours as needed.  Dispense: 30 tablet; Refill: 1 ? ?3. Diabetes mellitus with no complication (HCC) ?Due for  Urine Albumin-Creatinine with uACR  ?   ? ?The entirety of the information documented in the History of Present Illness, Review of Systems and Physical Exam were personally obtained by me. Portions of this information were initially documented by the CMA and reviewed by me for thoroughness and accuracy.   ? ? ?Mila Merry, MD  ?[facility] ?[phone] (phone) ?[phone] (fax) ? ?[facility] ?

## 2022-01-16 NOTE — Telephone Encounter (Signed)
Requested medication (s) are due for refill today: No ? ?Requested medication (s) are on the active medication list: No ? ?Last refill:  01/02/22 #15 with 0 RF ? ?Future visit scheduled: 03/21/22 ? ?Notes to clinic:  Not on current med list, not delegated. ? ? ?  ? ?Requested Prescriptions  ?Pending Prescriptions Disp Refills  ? traMADol (ULTRAM) 50 MG tablet 15 tablet 0  ?  Sig: Take 1 tablet (50 mg total) by mouth every 8 (eight) hours as needed for up to 5 days.  ?  ? Not Delegated - Analgesics:  Opioid Agonists Failed - 01/16/2022 12:25 PM  ?  ?  Failed - This refill cannot be delegated  ?  ?  Failed - Urine Drug Screen completed in last 360 days  ?  ?  Passed - Valid encounter within last 3 months  ?  Recent Outpatient Visits   ? ?      ? 2 weeks ago Acute right-sided low back pain with right-sided sciatica  ? [facility] Malva Limes, MD  ? 3 weeks ago Acute right-sided low back pain with right-sided sciatica  ? [facility] [place], Lillia Abed, PA-C  ? 3 months ago Diabetes mellitus with no complication (HCC)  ? [facility] Sherrie Mustache, Demetrios Isaacs, MD  ? 1 year ago Exposure to COVID-19 virus  ? [facility] Sherrie Mustache, Demetrios Isaacs, MD  ? 1 year ago COVID-19  ? [facility] [place], [place], [place]  ? ?  ?  ?Future Appointments   ? ?        ? Today Fisher, Demetrios Isaacs, MD [facility]  ? In 2 months Fisher, Demetrios Isaacs, MD [facility]  ? ?  ? ? ?  ?  ?  ? ? ?

## 2022-01-16 NOTE — Telephone Encounter (Signed)
Medication Refill - Medication: traMADol (ULTRAM) 50 MG tablet  ? ?Has the patient contacted their pharmacy? No. ? ?(Agent: If yes, when and what did the pharmacy advise?) Thought going straight to the Dr was better  ? ?Preferred Pharmacy (with phone number or street name):  ?[facility] - [place], [place] - [address] Phone:  [phone]  ?Fax:  [phone]  ?  ? ?Has the patient been seen for an appointment in the last year OR does the patient have an upcoming appointment? Yes.   ? ?Agent: Please be advised that RX refills may take up to 3 business days. We ask that you follow-up with your pharmacy. ? ?

## 2022-01-17 LAB — SPECIMEN STATUS REPORT

## 2022-01-17 LAB — MICROALBUMIN / CREATININE URINE RATIO
Creatinine, Urine: 33.8 mg/dL
Microalb/Creat Ratio: <9 mg/g creat (ref 0–29)
Microalbumin, Urine: <3 ug/mL

## 2022-01-20 ENCOUNTER — Other Ambulatory Visit: Payer: Self-pay | Admitting: Family Medicine

## 2022-01-20 DIAGNOSIS — E119 Type 2 diabetes mellitus without complications: Secondary | ICD-10-CM

## 2022-01-26 ENCOUNTER — Ambulatory Visit
Admission: RE | Admit: 2022-01-26 | Discharge: 2022-01-26 | Disposition: A | Discharge status: Home / Self Care | Payer: Medicaid Other | Source: Ambulatory Visit | Attending: Family Medicine | Admitting: Family Medicine

## 2022-01-26 DIAGNOSIS — M5441 Lumbago with sciatica, right side: Secondary | ICD-10-CM | POA: Diagnosis present

## 2022-01-26 DIAGNOSIS — M519 Unspecified thoracic, thoracolumbar and lumbosacral intervertebral disc disorder: Secondary | ICD-10-CM | POA: Diagnosis present

## 2022-01-27 ENCOUNTER — Other Ambulatory Visit: Payer: Self-pay

## 2022-01-27 ENCOUNTER — Emergency Department: Payer: Medicaid Other

## 2022-01-27 ENCOUNTER — Emergency Department
Admission: EM | Admit: 2022-01-27 | Discharge: 2022-01-27 | Disposition: A | Discharge status: Home / Self Care | Payer: Medicaid Other | Attending: Emergency Medicine | Admitting: Emergency Medicine

## 2022-01-27 DIAGNOSIS — T50905A Adverse effect of unspecified drugs, medicaments and biological substances, initial encounter: Secondary | ICD-10-CM | POA: Insufficient documentation

## 2022-01-27 DIAGNOSIS — M545 Low back pain, unspecified: Secondary | ICD-10-CM | POA: Insufficient documentation

## 2022-01-27 DIAGNOSIS — I10 Essential (primary) hypertension: Secondary | ICD-10-CM | POA: Insufficient documentation

## 2022-01-27 DIAGNOSIS — R519 Headache, unspecified: Secondary | ICD-10-CM | POA: Diagnosis present

## 2022-01-27 DIAGNOSIS — T887XXA Unspecified adverse effect of drug or medicament, initial encounter: Secondary | ICD-10-CM | POA: Insufficient documentation

## 2022-01-27 LAB — COMPREHENSIVE METABOLIC PANEL
ALT: 28 U/L (ref 0–44)
AST: 24 U/L (ref 15–41)
Albumin: 4.1 g/dL (ref 3.5–5.0)
Alkaline Phosphatase: 87 U/L (ref 38–126)
Anion gap: 12 (ref 5–15)
BUN: 15 mg/dL (ref 6–20)
CO2: 23 mmol/L (ref 22–32)
Calcium: 9.7 mg/dL (ref 8.9–10.3)
Chloride: 98 mmol/L (ref 98–111)
Creatinine, Ser: 0.51 mg/dL (ref 0.44–1.00)
GFR, Estimated: >60 mL/min (ref >60)
Glucose, Bld: 179 mg/dL — ABNORMAL HIGH (ref 70–99)
Potassium: 3.2 mmol/L — ABNORMAL LOW (ref 3.5–5.1)
Sodium: 133 mmol/L — ABNORMAL LOW (ref 135–145)
Total Bilirubin: 0.7 mg/dL (ref 0.3–1.2)
Total Protein: 8.1 g/dL (ref 6.5–8.1)

## 2022-01-27 LAB — CBC WITH DIFFERENTIAL/PLATELET
Abs Immature Granulocytes: 0.03 10*3/uL (ref 0.00–0.07)
Basophils Absolute: 0.1 10*3/uL (ref 0.0–0.1)
Basophils Relative: 1 %
Eosinophils Absolute: 0.1 10*3/uL (ref 0.0–0.5)
Eosinophils Relative: 2 %
HCT: 42.2 % (ref 36.0–46.0)
Hemoglobin: 14.8 g/dL (ref 12.0–15.0)
Immature Granulocytes: 0 %
Lymphocytes Relative: 32 %
Lymphs Abs: 2.5 10*3/uL (ref 0.7–4.0)
MCH: 30.8 pg (ref 26.0–34.0)
MCHC: 35.1 g/dL (ref 30.0–36.0)
MCV: 87.7 fL (ref 80.0–100.0)
Monocytes Absolute: 0.6 10*3/uL (ref 0.1–1.0)
Monocytes Relative: 8 %
Neutro Abs: 4.3 10*3/uL (ref 1.7–7.7)
Neutrophils Relative %: 57 %
Platelets: 420 10*3/uL — ABNORMAL HIGH (ref 150–400)
RBC: 4.81 MIL/uL (ref 3.87–5.11)
RDW: 12.6 % (ref 11.5–15.5)
WBC: 7.6 10*3/uL (ref 4.0–10.5)
nRBC: 0 % (ref 0.0–0.2)

## 2022-01-27 LAB — TROPONIN I (HIGH SENSITIVITY): Troponin I (High Sensitivity): 3 ng/L (ref <18)

## 2022-01-27 LAB — MAGNESIUM: Magnesium: 1.7 mg/dL (ref 1.7–2.4)

## 2022-01-27 LAB — TSH: TSH: 1.548 u[IU]/mL (ref 0.350–4.500)

## 2022-01-27 MED ORDER — LACTATED RINGERS IV BOLUS
1000.0000 mL | Freq: Once | INTRAVENOUS | Status: AC
  Administered 2022-01-27: 1000 mL via INTRAVENOUS

## 2022-01-27 MED ORDER — POTASSIUM CHLORIDE CRYS ER 20 MEQ PO TBCR
40.0000 meq | EXTENDED_RELEASE_TABLET | Freq: Once | ORAL | Status: AC
  Administered 2022-01-27: 40 meq via ORAL
  Filled 2022-01-27: qty 2

## 2022-01-27 NOTE — Discharge Instructions (Addendum)
As we discussed, ? ?I suspect your symptoms are from a combination of a recent viral illness and the steroids for your back. ? ?If your BP remains elevated and HR remains elevated at home, you can take 50 mg PO metoprolol as needed. ? ?If BP/HR remains elevated, call your Cardiologist and return to the ER ? ?Consider trying the Xanax as well to help with some of your symptoms ?

## 2022-01-27 NOTE — ED Provider Notes (Signed)
? ?[facility] ?Provider Note ? ? ? Event Date/Time  ? First MD Initiated Contact with Patient 01/27/22 1047   ?  (approximate) ? ? ?History  ? ?Headache and Hypertension ? ? ?HPI ? ?Gabrielle Owens is a 51 y.o. female here with multiple complaints.  The patient states that over the last week, she has had increasing intermittent headache, fatigue, chills, sweats, nausea, and general malaise.  She states that she has had multiple issues going on.  She has been dealing with increasing lower back pain for which she has been seen by her PCP and recently had an MRI.  She has been on 2 courses of prednisone for this.  This pain has actually improved, however.  Over the last week, the patient has also began to develop intermittent chills, some abdominal cramping that was intermittent, as well as diarrhea.  She had some low-grade fevers.  She states that because of this, she had decreased appetite throughout the week and has felt generally unwell.  She has begun having episodes in which she feels like her heart is racing, she feels lightheaded, and has recorded blood pressures in the 180s over 100s with heart rates in the 160s at home.  She does not have a known history of A-fib.  She been taking her medications as prescribed.  While her abdominal pain and diarrhea seems to be improving, her generalized weakness, headache, and episodes of hypertension and tachycardia seem to be persisting.  No other medication changes.  No drug use.  No other complaints.  No current chest pain or shortness of breath. ?  ? ? ?Physical Exam  ? ?Triage Vital Signs: ?ED Triage Vitals  ?Enc Vitals Group  ?   BP 01/27/22 1006 (!) 153/108  ?   Pulse Rate 01/27/22 1006 92  ?   Resp 01/27/22 1006 20  ?   Temp 01/27/22 1006 98.7 ?F (37.1 ?C)  ?   Temp Source 01/27/22 1006 Oral  ?   SpO2 01/27/22 1006 98 %  ?   Weight 01/27/22 1006 193 lb (87.5 kg)  ?   Height 01/27/22 1006 5\' 2"  (1.575 m)  ?   Head Circumference --   ?   Peak Flow  --   ?   Pain Score 01/27/22 1013 5  ?   Pain Loc --   ?   Pain Edu? --   ?   Excl. in [place]? --   ? ? ?Most recent vital signs: ?Vitals:  ? 01/27/22 1006 01/27/22 1244  ?BP: (!) 153/108 (!) 136/96  ?Pulse: 92 78  ?Resp: 20 18  ?Temp: 98.7 ?F (37.1 ?C)   ?SpO2: 98% 98%  ? ? ? ?General: Awake, no distress.  ?CV:  Good peripheral perfusion.  Slight tachycardia, no murmurs or rubs.  Pulses 2+ and symmetric bilateral upper and lower extremities. ?Resp:  Normal effort.  No rales.  Normal work of breathing. ?Abd:  No distention.  No tenderness.  Specifically, no right lower or left lower quadrant tenderness.  No rebound or guarding. ?Other:  Slightly dry mucous membranes.  Neck supple with no palpable thyromegaly or bruit. ? ? ?ED Results / Procedures / Treatments  ? ?Labs ?(all labs ordered are listed, but only abnormal results are displayed) ?Labs Reviewed  ?CBC WITH DIFFERENTIAL/PLATELET - Abnormal; Notable for the following components:  ?    Result Value  ? Platelets 420 (*)   ? All other components within normal limits  ?COMPREHENSIVE METABOLIC PANEL -  Abnormal; Notable for the following components:  ? Sodium 133 (*)   ? Potassium 3.2 (*)   ? Glucose, Bld 179 (*)   ? All other components within normal limits  ?TSH  ?MAGNESIUM  ?TROPONIN I (HIGH SENSITIVITY)  ?TROPONIN I (HIGH SENSITIVITY)  ? ? ? ?EKG ?Normal sinus rhythm, ventricular rate 86.  PR 176, QRS 80, QTc 449.  No acute ST elevations or depressions.  T wave inversion in lead III, otherwise unremarkable.  No ischemic changes. ? ? 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 ? ?I also independently reviewed and agree with radiologist interpretations. ? ? ?PROCEDURES: ? ?Critical Care performed: No ? ?Procedures ? ? ? ?MEDICATIONS ORDERED IN ED: ?Medications  ?lactated ringers bolus 1,000 mL (0 mLs Intravenous Stopped 01/27/22 1321)  ?potassium chloride SA (KLOR-CON M) CR tablet 40  mEq (40 mEq Oral Given 01/27/22 1145)  ? ? ? ?IMPRESSION / MDM / ASSESSMENT AND PLAN / ED COURSE  ?I reviewed the triage vital signs and the nursing notes. ?             ?               ? ? ?The patient is on the cardiac monitor to evaluate for evidence of arrhythmia and/or significant heart rate changes. ? ? ?Ddx:  ? ? ? ?MDM: 51 year old female with history of significant hypertension here with general fatigue, headache, sensation of her heart racing and hypertension.  Suspect symptoms are multifactorial and secondary to multiple recent courses of antibiotics with her underlying chronic hypertension and palpitations, as well as what sounds like possibly a viral GI illness over the last several days.  This seems to be resolving.  Her abdomen is soft and nontender.  Here, blood pressure is fairly well controlled with reassurance.  CBC shows no leukocytosis or anemia.  CMP with normal renal function.  Troponin negative, EKG nonischemic and I do not suspect ACS.  Chest x-ray is clear.  CT head shows possible age advanced ischemic disease which would fit with her history of chronic hypertension, no focal abnormalities.  No history of migraines.  No focal neurological deficit to suggest demyelination.  Headache seems more so related to stressors as well as transient increases in her blood pressure. ? ?On telemetry, patient has no evidence of arrhythmia.  We will encourage the patient to take an additional dose of metoprolol as needed for her tachycardia, which she has done in the past.  She has been on as high as 150 mg metoprolol twice a day.  Of note, her symptoms actually correlate with when this was decreased as well as when she was placed on prednisone.  She has no evidence of A-fib or arrhythmia here.  She will follow-up with her PCP, who can also follow-up her CT findings.  Return precautions given. ? ? ? ?MEDICATIONS GIVEN IN ED: ?Medications  ?lactated ringers bolus 1,000 mL (0 mLs Intravenous Stopped 01/27/22  1321)  ?potassium chloride SA (KLOR-CON M) CR tablet 40 mEq (40 mEq Oral Given 01/27/22 1145)  ? ? ? ?Consults:  ? ? ? ?EMR reviewed  ?Reviewed prior PCP notes, cardiology notes with Dr. Saralyn Pilar.  Reviewed office visit with Dr. Caryn Section on 12/2021 ? ? ? ? ?FINAL CLINICAL IMPRESSION(S) / ED DIAGNOSES  ? ?Final diagnoses:  ?Primary hypertension  ?Adverse effect of drug, initial encounter  ? ? ? ?Rx / DC Orders  ? ?ED Discharge  Orders   ? ? None  ? ?  ? ? ? ?Note:  This document was prepared using Dragon voice recognition software and may include unintentional dictation errors. ?  ?Duffy Bruce, MD ?01/27/22 1727 ? ?

## 2022-01-27 NOTE — ED Triage Notes (Addendum)
Patient to ER via [facility] with complaints of hypertension and headache x1 week. Also reports poor appetite and watery diarrhea. Denies vision changes/ dizziness.  ? ?Reports taking multiple BP meds with little improvement. States her BP has been as high as 180 systolic. States that her fitbit showed her HR yesterday was elevated over 120.  ?

## 2022-01-31 ENCOUNTER — Telehealth: Payer: Self-pay | Admitting: Gastroenterology

## 2022-01-31 ENCOUNTER — Telehealth: Payer: Self-pay

## 2022-01-31 NOTE — Telephone Encounter (Signed)
Patient has questions about colonoscopy. Requesting a call back.  ?

## 2022-01-31 NOTE — Telephone Encounter (Signed)
Called patient because Britta Mccreedy, RN from endoscopy unit stated that the patient had called them to let them know that she had recently been at the hospital for a stomach bug. Patient wanted to know if she needed to reschedule her procedure or not. I then asked Dr.Anna for his advise and he stated that if the patient did not have any gi symptoms, then she is able to have her procedure on 02/03/22. Waiting on patient to let us know. ?

## 2022-02-01 ENCOUNTER — Other Ambulatory Visit: Payer: Self-pay

## 2022-02-01 DIAGNOSIS — Z1211 Encounter for screening for malignant neoplasm of colon: Secondary | ICD-10-CM

## 2022-02-01 NOTE — Addendum Note (Signed)
Addended by: Adela Ports on: 02/01/2022 03:42 PM ? ? Modules accepted: Orders ? ?

## 2022-02-01 NOTE — Telephone Encounter (Signed)
Please read other phone note from 01/31/2022. ?

## 2022-02-03 ENCOUNTER — Ambulatory Visit: Admit: 2022-02-03 | Payer: Medicaid Other | Admitting: Gastroenterology

## 2022-02-03 ENCOUNTER — Other Ambulatory Visit: Payer: Self-pay | Admitting: Family Medicine

## 2022-02-03 SURGERY — COLONOSCOPY WITH PROPOFOL
Anesthesia: General

## 2022-02-03 NOTE — Telephone Encounter (Signed)
Requested medication (s) are due for refill today: historical med ? ?Requested medication (s) are on the active medication list: yes ? ?Last refill:  na ? ?Future visit scheduled: yes in 1 month ? ?Notes to clinic:  historical medication. Do you want to order Rx? ? ? ?  ?Requested Prescriptions  ?Pending Prescriptions Disp Refills  ? simvastatin (ZOCOR) 20 MG tablet [Pharmacy Med Name: SIMVASTATIN 20 MG TAB] 90 tablet   ?  Sig: TAKE ONE TABLET EACH NIGHT  ?  ? Cardiovascular:  Antilipid - Statins Failed - 02/03/2022 12:44 PM  ?  ?  Failed - Lipid Panel in normal range within the last 12 months  ?  Cholesterol, Total  ?Date Value Ref Range Status  ?09/20/2021 198 100 - 199 mg/dL Final  ? ?Cholesterol  ?Date Value Ref Range Status  ?11/20/2012 377 (H) 0 - 200 mg/dL Final  ? ?Ldl Cholesterol, Calc  ?Date Value Ref Range Status  ?11/20/2012 SEE COMMENT 0 - 100 mg/dL Final  ?  Comment:  ?  VLDL/LDL - Unable to report VLDL and LDL due to a ? - Triglyceride value that is 400 mg/dL or  ? - greater. ?CHOLESTEROL - REPEATED AND CONFIRMED BY DILUTION ?  ? ?LDL Chol Calc (NIH)  ?Date Value Ref Range Status  ?09/20/2021 112 (H) 0 - 99 mg/dL Final  ? ?HDL Cholesterol  ?Date Value Ref Range Status  ?11/20/2012 23 (L) 40 - 60 mg/dL Final  ? ?HDL  ?Date Value Ref Range Status  ?09/20/2021 34 (L) >39 mg/dL Final  ? ?Triglycerides  ?Date Value Ref Range Status  ?09/20/2021 300 (H) 0 - 149 mg/dL Final  ?20/35/5974 163 (H) 0 - 200 mg/dL Final  ? ?  ?  ?  Passed - Patient is not pregnant  ?  ?  Passed - Valid encounter within last 12 months  ?  Recent Outpatient Visits   ? ?      ? 2 weeks ago Lumbar disc disease  ? [facility] Fisher, Demetrios Isaacs, MD  ? 1 month ago Acute right-sided low back pain with right-sided sciatica  ? [facility] Fisher, Demetrios Isaacs, MD  ? 1 month ago Acute right-sided low back pain with right-sided sciatica  ? [facility] [place], Lillia Abed, PA-C  ? 4 months ago Diabetes  mellitus with no complication (HCC)  ? [facility] Sherrie Mustache, Demetrios Isaacs, MD  ? 1 year ago Exposure to COVID-19 virus  ? [facility] Sherrie Mustache, Demetrios Isaacs, MD  ? ?  ?  ?Future Appointments   ? ?        ? In 1 month Fisher, Demetrios Isaacs, MD [facility]  ? ?  ? ? ?  ?  ?  ? ?

## 2022-02-07 ENCOUNTER — Other Ambulatory Visit: Payer: Self-pay | Admitting: Family Medicine

## 2022-02-07 DIAGNOSIS — F439 Reaction to severe stress, unspecified: Secondary | ICD-10-CM

## 2022-02-07 NOTE — Telephone Encounter (Signed)
Medication Refill - Medication:  ?ALPRAZolam (XANAX) 0.5 MG tablet  ? ?Has the patient contacted their pharmacy? Yes.   ?Contact PCP ? ?Preferred Pharmacy (with phone number or street name):  ?[facility] - [place], [place] - [place]  ?[address] [place], [place] [address]  ?Phone:  [phone]  Fax:  [phone]  ? ?Has the patient been seen for an appointment in the last year OR does the patient have an upcoming appointment? Yes.   ? ?Agent: Please be advised that RX refills may take up to 3 business days. We ask that you follow-up with your pharmacy. ?

## 2022-02-08 MED ORDER — ALPRAZOLAM 0.5 MG PO TABS: 0.5000 mg | ORAL_TABLET | Freq: Three times a day (TID) | ORAL | 3 refills | Status: DC | PRN

## 2022-02-08 NOTE — Telephone Encounter (Signed)
Requested medication (s) are due for refill today: yes ? ?Requested medication (s) are on the active medication list: yes   ? ?Last refill: 3.17.23  #30  3 refills ? ?Future visit scheduled yes 03/21/22 ? ?Notes to clinic:not delegated, please review. Thank you. ? ?Requested Prescriptions  ?Pending Prescriptions Disp Refills  ? ALPRAZolam (XANAX) 0.5 MG tablet 30 tablet 3  ?  Sig: Take 1 tablet (0.5 mg total) by mouth every 8 (eight) hours as needed.  ?  ? Not Delegated - Psychiatry: Anxiolytics/Hypnotics 2 Failed - 02/07/2022  8:31 AM  ?  ?  Failed - This refill cannot be delegated  ?  ?  Failed - Urine Drug Screen completed in last 360 days  ?  ?  Passed - Patient is not pregnant  ?  ?  Passed - Valid encounter within last 6 months  ?  Recent Outpatient Visits   ? ?      ? 3 weeks ago Lumbar disc disease  ? [facility] Fisher, Demetrios Isaacs, MD  ? 1 month ago Acute right-sided low back pain with right-sided sciatica  ? [facility] Fisher, Demetrios Isaacs, MD  ? 1 month ago Acute right-sided low back pain with right-sided sciatica  ? [facility] Edwards, Lillia Abed, PA-C  ? 4 months ago Diabetes mellitus with no complication (HCC)  ? [facility] Sherrie Mustache, Demetrios Isaacs, MD  ? 1 year ago Exposure to COVID-19 virus  ? [facility] Sherrie Mustache, Demetrios Isaacs, MD  ? ?  ?  ?Future Appointments   ? ?        ? In 1 month Fisher, Demetrios Isaacs, MD [facility]  ? ?  ? ? ?  ?  ?  ? ? ? ? ?

## 2022-02-10 ENCOUNTER — Other Ambulatory Visit: Payer: Self-pay | Admitting: Family Medicine

## 2022-02-10 DIAGNOSIS — E119 Type 2 diabetes mellitus without complications: Secondary | ICD-10-CM

## 2022-02-21 ENCOUNTER — Other Ambulatory Visit: Payer: Self-pay | Admitting: Family Medicine

## 2022-02-28 ENCOUNTER — Ambulatory Visit: Payer: Self-pay | Admitting: *Deleted

## 2022-02-28 NOTE — Telephone Encounter (Signed)
Taking an extra dose of metformin wouldn't help. She could take 1/2 glipizide this evening, then start back on a full tablet tomorrow morning.

## 2022-02-28 NOTE — Telephone Encounter (Signed)
  Chief Complaint: Glucose 350 Symptoms: blurred vision, better now Frequency: just happened, on Prednisone  Pertinent Negatives: Patient denies other symptoms Disposition: [] ED /[] Urgent Care (no appt availability in office) / [] Appointment(In office/virtual)/ []  [facility]/ [] Home Care/ [] Refused Recommended Disposition /[]  Mobile Bus/ [x]  Follow-up with PCP Additional Notes: Pt at work, did not take her Glipizide this morning, does have her Metformin with her. She is on 30mg  of Prednisone daily at this point (from ortho). Already took two days Prednisone at 40mg . Does not feel bad. Asking about taking extra Metformin. (There is not an appt open this afternoon.)  Reason for Disposition  [1] Blood glucose > 300 mg/dL (16.7 mmol/L) AND [2] two or more times in a row  Answer Assessment - Initial Assessment Questions 1. BLOOD GLUCOSE: "What is your blood glucose level?"      335-350 2. ONSET: "When did you check the blood glucose?"     Multiple times this afternoon 3. USUAL RANGE: "What is your glucose level usually?" (e.g., usual fasting morning value, usual evening value)     120-150 4. KETONES: "Do you check for ketones (urine or blood test strips)?" If yes, ask: "What does the test show now?"      no 5. TYPE 1 or 2:  "Do you know what type of diabetes you have?"  (e.g., Type 1, Type 2, Gestational; doesn't know)      Type 2 6. INSULIN: "Do you take insulin?" "What type of insulin(s) do you use? What is the mode of delivery? (syringe, pen; injection or pump)?"      no 7. DIABETES PILLS: "Do you take any pills for your diabetes?" If yes, ask: "Have you missed taking any pills recently?"     Yes, Glucophage and Glipizide 8. OTHER SYMPTOMS: "Do you have any symptoms?" (e.g., fever, frequent urination, difficulty breathing, dizziness, weakness, vomiting)     no 9. PREGNANCY: "Is there any chance you are pregnant?" "When was your last menstrual period?"      na  Protocols used: Diabetes - High Blood Sugar-A-AH

## 2022-02-28 NOTE — Telephone Encounter (Signed)
Patient advised and verbalized understanding 

## 2022-03-03 ENCOUNTER — Encounter: Payer: Self-pay | Admitting: Registered Nurse

## 2022-03-03 ENCOUNTER — Ambulatory Visit: Admission: RE | Admit: 2022-03-03 | Payer: Medicaid Other | Source: Home / Self Care | Admitting: Gastroenterology

## 2022-03-03 ENCOUNTER — Encounter: Admission: RE | Payer: Self-pay | Source: Home / Self Care

## 2022-03-03 SURGERY — COLONOSCOPY WITH PROPOFOL
Anesthesia: General

## 2022-03-20 NOTE — Progress Notes (Signed)
I,Jana Robinson,acting as a scribe for Mila Merry, MD.,have documented all relevant documentation on the behalf of Mila Merry, MD,as directed by  Mila Merry, MD while in the presence of Mila Merry, MD.  Established patient visit   Patient: Gabrielle Owens   DOB: 03/08/1971   51 y.o. Female  MRN: [id number] Visit Date: 03/21/2022  Today's healthcare provider: Mila Merry, MD   Chief Complaint  Patient presents with   Diabetes   Hypertension   Subjective    Diabetes Mellitus Type II, Follow-up  Lab Results  Component Value Date   HGBA1C 6.5 (A) 09/20/2021   HGBA1C 6.1 (A) 08/25/2020   HGBA1C 6.4 (H) 05/01/2018   Wt Readings from Last 3 Encounters:  03/21/22 191 lb 11.2 oz (87 kg)  01/27/22 193 lb (87.5 kg)  01/16/22 194 lb (88 kg)   Last seen for diabetes 6 months ago.  Management since then includes continue current medication. She reports excellent compliance with treatment. She is not having side effects. Symptoms: No fatigue No foot ulcerations  No appetite changes No nausea  No paresthesia of the feet  No polydipsia  No polyuria No visual disturbances   No vomiting     Home blood sugar records: fasting range: average 140-150's  Episodes of hypoglycemia? Yes   Current insulin regiment: none Most Recent Eye Exam: reports April '22 Current exercise: none Current diet habits: in general, a "healthy" diet    Pertinent Labs: Lab Results  Component Value Date   CHOL 198 09/20/2021   HDL 34 (L) 09/20/2021   LDLCALC 112 (H) 09/20/2021   TRIG 300 (H) 09/20/2021   CHOLHDL 5.8 (H) 09/20/2021   Lab Results  Component Value Date   NA 133 (L) 01/27/2022   K 3.2 (L) 01/27/2022   CREATININE 0.51 01/27/2022   GFRNONAA >60 01/27/2022   LABMICR <3.0 01/16/2022     --------------------------------------------------------------------------------------------------- Hypertension, follow-up  BP Readings from Last 3 Encounters:  03/21/22 (!) 163/88   01/27/22 (!) 136/96  01/16/22 124/76   Wt Readings from Last 3 Encounters:  03/21/22 191 lb 11.2 oz (87 kg)  01/27/22 193 lb (87.5 kg)  01/16/22 194 lb (88 kg)     She was last seen for hypertension 6 months ago.  BP at that visit was 150/75. Management since that visit includes.She is going to start check BP at home an schedule early follow up with her cardiologist if consistently above 140/90. Metoprolol 250. Per cardiologist  She reports good compliance with treatment. She is not having side effects. She is following a Regular diet. She is not exercising. She does not smoke.  Use of agents associated with hypertension: none.   Outside blood pressures are average 150/96  Symptoms: Yes chest pain No chest pressure  Yes palpitations No syncope  Yes dyspnea No orthopnea  Yes paroxysmal nocturnal dyspnea No lower extremity edema   Pertinent labs Lab Results  Component Value Date   CHOL 198 09/20/2021   HDL 34 (L) 09/20/2021   LDLCALC 112 (H) 09/20/2021   TRIG 300 (H) 09/20/2021   CHOLHDL 5.8 (H) 09/20/2021   Lab Results  Component Value Date   NA 133 (L) 01/27/2022   K 3.2 (L) 01/27/2022   CREATININE 0.51 01/27/2022   GFRNONAA >60 01/27/2022   GLUCOSE 179 (H) 01/27/2022   TSH 1.548 01/27/2022     The 10-year ASCVD risk score (Arnett DK, et al., 2019) is: 9.7%  ---------------------------------------------------------------------------------------------------   Medications: Outpatient Medications Prior  to Visit  Medication Sig   ALPRAZolam (XANAX) 0.5 MG tablet Take 1 tablet (0.5 mg total) by mouth every 8 (eight) hours as needed.   aspirin 81 MG tablet Take 81 mg by mouth daily.   escitalopram (LEXAPRO) 10 MG tablet TAKE 1 TABLET BY MOUTH AT BEDTIME   glipiZIDE (GLUCOTROL) 5 MG tablet TAKE 1 TABLET BY MOUTH DAILY BEFORE BREAKFAST FOR 14 DAYS   hydrochlorothiazide (HYDRODIURIL) 25 MG tablet TAKE 1 TABLET BY MOUTH DAILY   losartan (COZAAR) 100 MG tablet Take 100  mg by mouth daily.   metFORMIN (GLUCOPHAGE) 1000 MG tablet TAKE ONE (1) TABLET BY MOUTH TWO TIMES PER DAY WITH A MEAL   metoprolol (LOPRESSOR) 100 MG tablet Take 1 tablet by mouth 2 (two) times daily.   PROAIR HFA 108 (90 Base) MCG/ACT inhaler    simvastatin (ZOCOR) 20 MG tablet TAKE ONE TABLET EACH NIGHT   tiZANidine (ZANAFLEX) 4 MG tablet Take by mouth.   traMADol (ULTRAM) 50 MG tablet Take 1 tablet (50 mg total) by mouth every 8 (eight) hours as needed.   nitroGLYCERIN (NITROSTAT) 0.4 MG SL tablet Place under the tongue.   No facility-administered medications prior to visit.    Review of Systems     Objective    BP (!) 163/88 (BP Location: Right Arm, Patient Position: Sitting, Cuff Size: Normal)   Pulse 78   Temp 98.7 F (37.1 C) (Oral)   Resp 16   Wt 191 lb 11.2 oz (87 kg)   SpO2 100%   BMI 35.06 kg/m    Physical Exam   General appearance: Mildly obese female, cooperative and in no acute distress Head: Normocephalic, without obvious abnormality, atraumatic Respiratory: Respirations even and unlabored, normal respiratory rate Extremities: All extremities are intact.  Skin: Skin color, texture, turgor normal. No rashes seen  Psych: Appropriate mood and affect. Neurologic: Mental status: Alert, oriented to person, place, and time, thought content appropriate.   Results for orders placed or performed in visit on 03/21/22  POCT glycosylated hemoglobin (Hb A1C)  Result Value Ref Range   Hemoglobin A1C 7.1 (A) 4.0 - 5.6 %   Est. average glucose Bld gHb Est-mCnc 157     Assessment & Plan    1. Diabetes mellitus with no complication (HCC) Up a bit but has had several rounds of oral and injectable steroids for her back Continue current medications.    2. Essential (primary) hypertension Uncontrolled. Change losartan to  valsartan (DIOVAN) 160 MG tablet; Take 1 tablet (160 mg total) by mouth daily.  Dispense: 90 tablet; Refill: 0      The entirety of the information  documented in the History of Present Illness, Review of Systems and Physical Exam were personally obtained by me. Portions of this information were initially documented by the CMA and reviewed by me for thoroughness and accuracy.     Mila Merry, MD  [facility] [phone] (phone) [phone] (fax)  [facility]

## 2022-03-21 ENCOUNTER — Encounter: Payer: Self-pay | Admitting: Family Medicine

## 2022-03-21 ENCOUNTER — Ambulatory Visit: Payer: Medicaid Other | Admitting: Family Medicine

## 2022-03-21 VITALS — BP 163/88 | HR 78 | Temp 98.7°F | Resp 16 | Wt 191.7 lb

## 2022-03-21 DIAGNOSIS — E119 Type 2 diabetes mellitus without complications: Secondary | ICD-10-CM | POA: Diagnosis not present

## 2022-03-21 DIAGNOSIS — I10 Essential (primary) hypertension: Secondary | ICD-10-CM | POA: Diagnosis not present

## 2022-03-21 LAB — POCT GLYCOSYLATED HEMOGLOBIN (HGB A1C)
Est. average glucose Bld gHb Est-mCnc: 157
Hemoglobin A1C: 7.1 % — AB (ref 4.0–5.6)

## 2022-03-21 MED ORDER — VALSARTAN 160 MG PO TABS: 160.0000 mg | ORAL_TABLET | Freq: Every day | ORAL | 0 refills | Status: DC

## 2022-03-21 NOTE — Patient Instructions (Addendum)
Please review the attached list of medications and notify my office if there are any errors.   Please bring all of your medications to every appointment so we can make sure that our medication list is the same as yours.   Please call the [facility] [phone]) to schedule a routine screening mammogram.   Please contact your eyecare professional to schedule a routine eye exam

## 2022-04-03 ENCOUNTER — Other Ambulatory Visit: Payer: Self-pay | Admitting: Family Medicine

## 2022-04-03 DIAGNOSIS — F419 Anxiety disorder, unspecified: Secondary | ICD-10-CM

## 2022-04-11 ENCOUNTER — Ambulatory Visit: Payer: Self-pay | Admitting: *Deleted

## 2022-04-11 NOTE — Telephone Encounter (Signed)
  Chief Complaint: Hypertension Symptoms: BP at ortho appt today 166/100. Advised to see PCP.States has been running 177/96-110. No missed meds. States has eliminated caffeine from diet, "Doing all the right things and still elevated." Also concerned her HR has been elevated at rest "Usually 60s, now 80's"  States is taking Xanax as ordered.States "Sometimes I get a pain in left breast area."Not presently. Pt anxious regarding increase in BP. Frequency:Today Pertinent Negatives: Patient denies headache, visual changes, dizziness. Disposition: [] ED /[] Urgent Care (no appt availability in office) / [x] Appointment(In office/virtual)/ []  [place] [facility]/ [] Home Care/ [] Refused Recommended Disposition /[] [place] [facility]/ []  Follow-up with PCP Additional Notes: Care advise provided, pt verbalizes understanding. Reason for Disposition  Systolic BP  >= 180 OR Diastolic >= 110  Answer Assessment - Initial Assessment Questions 1. BLOOD PRESSURE: "What is the blood pressure?" "Did you take at least two measurements 5 minutes apart?"     166/100 2. ONSET: "When did you take your blood pressure?"     At ortho appt this afternoon 3. HOW: "How did you obtain the blood pressure?" (e.g., visiting nurse, automatic home BP monitor)     At ortho appt 4. HISTORY: "Do you have a history of high blood pressure?"     yes 5. MEDICATIONS: "Are you taking any medications for blood pressure?" "Have you missed any doses recently?"     No 6. OTHER SYMPTOMS: "Do you have any symptoms?" (e.g., headache, chest pain, blurred vision, difficulty breathing, weakness)     No. "Sometimes little pains in left breast."  Protocols used: Blood Pressure - High-A-AH

## 2022-04-12 ENCOUNTER — Encounter: Payer: Self-pay | Admitting: Family Medicine

## 2022-04-12 ENCOUNTER — Ambulatory Visit (INDEPENDENT_AMBULATORY_CARE_PROVIDER_SITE_OTHER): Payer: Medicaid Other | Admitting: Family Medicine

## 2022-04-12 VITALS — BP 140/80 | HR 90 | Resp 16 | Wt 190.0 lb

## 2022-04-12 DIAGNOSIS — I10 Essential (primary) hypertension: Secondary | ICD-10-CM

## 2022-04-12 MED ORDER — AMLODIPINE BESYLATE 5 MG PO TABS: 5.0000 mg | ORAL_TABLET | Freq: Every day | ORAL | 1 refills | Status: DC

## 2022-04-12 MED ORDER — AMLODIPINE BESYLATE 5 MG PO TABS: 5.0000 mg | ORAL_TABLET | Freq: Every evening | ORAL | 1 refills | Status: DC

## 2022-04-12 NOTE — Progress Notes (Unsigned)
Established patient visit   Patient: Gabrielle Owens   DOB: Jan 17, 1971   51 y.o. Female  MRN: [id number] Visit Date: 04/12/2022  Today's healthcare provider: Megan Mans, MD   Chief Complaint  Patient presents with   Follow-up   Hypertension   Subjective    HPI  Patient comes in today as she has had recent elevated blood pressures when she is seeing her spinal surgeon.  Systolics have been in the 150s to 160s there.  She is tolerating present medications.  Hypertension, follow-up  BP Readings from Last 3 Encounters:  04/12/22 140/80  03/21/22 (!) 163/88  01/27/22 (!) 136/96   Wt Readings from Last 3 Encounters:  04/12/22 190 lb (86.2 kg)  03/21/22 191 lb 11.2 oz (87 kg)  01/27/22 193 lb (87.5 kg)     She was last seen for hypertension 3 weeks ago.  Management since that visit includes; Uncontrolled. Changed losartan to  valsartan (DIOVAN) 160 MG tablet; Take 1 tablet (160 mg total) by mouth daily.   Outside blood pressures are 166/100.  Pertinent labs Lab Results  Component Value Date   CHOL 198 09/20/2021   HDL 34 (L) 09/20/2021   LDLCALC 112 (H) 09/20/2021   TRIG 300 (H) 09/20/2021   CHOLHDL 5.8 (H) 09/20/2021   Lab Results  Component Value Date   NA 133 (L) 01/27/2022   K 3.2 (L) 01/27/2022   CREATININE 0.51 01/27/2022   GFRNONAA >60 01/27/2022   GLUCOSE 179 (H) 01/27/2022   TSH 1.548 01/27/2022     The 10-year ASCVD risk score (Arnett DK, et al., 2019) is: 7.2%  ---------------------------------------------------------------------------------------------------   Medications: Outpatient Medications Prior to Visit  Medication Sig   ALPRAZolam (XANAX) 0.5 MG tablet Take 1 tablet (0.5 mg total) by mouth every 8 (eight) hours as needed.   aspirin 81 MG tablet Take 81 mg by mouth daily.   escitalopram (LEXAPRO) 10 MG tablet TAKE 1 TABLET BY MOUTH AT BEDTIME   gabapentin (NEURONTIN) 300 MG capsule Take 300 mg by mouth 5 (five) times daily.    glipiZIDE (GLUCOTROL) 5 MG tablet TAKE 1 TABLET BY MOUTH DAILY BEFORE BREAKFAST FOR 14 DAYS   hydrochlorothiazide (HYDRODIURIL) 25 MG tablet TAKE 1 TABLET BY MOUTH DAILY   metFORMIN (GLUCOPHAGE) 1000 MG tablet TAKE ONE (1) TABLET BY MOUTH TWO TIMES PER DAY WITH A MEAL   metoprolol (LOPRESSOR) 100 MG tablet Take 1 tablet by mouth 2 (two) times daily. Taking 2 1/2 Tablets daily   PROAIR HFA 108 (90 Base) MCG/ACT inhaler    simvastatin (ZOCOR) 20 MG tablet TAKE ONE TABLET EACH NIGHT   tiZANidine (ZANAFLEX) 4 MG tablet Take by mouth.   traMADol (ULTRAM) 50 MG tablet Take 1 tablet (50 mg total) by mouth every 8 (eight) hours as needed.   valsartan (DIOVAN) 160 MG tablet Take 1 tablet (160 mg total) by mouth daily.   nitroGLYCERIN (NITROSTAT) 0.4 MG SL tablet Place under the tongue.   No facility-administered medications prior to visit.    Review of Systems  Constitutional:  Negative for appetite change, chills, fatigue and fever.  Respiratory:  Negative for chest tightness and shortness of breath.   Cardiovascular:  Negative for chest pain and palpitations.  Gastrointestinal:  Negative for abdominal pain, nausea and vomiting.  Neurological:  Negative for dizziness and weakness.    Last thyroid functions Lab Results  Component Value Date   TSH 1.548 01/27/2022       Objective  BP 140/80 (BP Location: Right Arm, Patient Position: Sitting, Cuff Size: Large)   Pulse 90   Resp 16   Wt 190 lb (86.2 kg)   SpO2 98%   BMI 34.75 kg/m  BP Readings from Last 3 Encounters:  04/12/22 140/80  03/21/22 (!) 163/88  01/27/22 (!) 136/96   Wt Readings from Last 3 Encounters:  04/12/22 190 lb (86.2 kg)  03/21/22 191 lb 11.2 oz (87 kg)  01/27/22 193 lb (87.5 kg)      Physical Exam Constitutional:      General: She is not in acute distress.    Appearance: She is well-developed.  HENT:     Head: Normocephalic and atraumatic.     Right Ear: Hearing normal.     Left Ear: Hearing normal.      Nose: Nose normal.  Eyes:     General: Lids are normal. No scleral icterus.       Right eye: No discharge.        Left eye: No discharge.     Conjunctiva/sclera: Conjunctivae normal.  Cardiovascular:     Rate and Rhythm: Normal rate and regular rhythm.     Heart sounds: Normal heart sounds.  Pulmonary:     Effort: Pulmonary effort is normal. No respiratory distress.  Musculoskeletal:     Right lower leg: No edema.     Left lower leg: No edema.  Skin:    Findings: No lesion or rash.  Neurological:     General: No focal deficit present.     Mental Status: She is alert and oriented to person, place, and time.  Psychiatric:        Mood and Affect: Mood normal.        Speech: Speech normal.        Behavior: Behavior normal.        Thought Content: Thought content normal.        Judgment: Judgment normal.       No results found for any visits on 04/12/22.  Assessment & Plan     1. Essential (primary) hypertension Added amlodipine 5 mg in the evenings.  Follow-up in 2 weeks when she already has an appointment. - amLODipine (NORVASC) 5 MG tablet; Take 1 tablet (5 mg total) by mouth daily.  Dispense: 30 tablet; Refill: 1   Return in about 1 week (around 04/19/2022).      I, Megan Mans, MD, have reviewed all documentation for this visit. The documentation on 04/13/22 for the exam, diagnosis, procedures, and orders are all accurate and complete.    Lynsey Ange Wendelyn Breslow, MD  [facility] [phone] (phone) [phone] (fax)  [facility]

## 2022-04-12 NOTE — Telephone Encounter (Signed)
[facility] triage scheduled appointment today at 9:20am with Dr. Sullivan Lone.

## 2022-04-17 ENCOUNTER — Telehealth: Payer: Medicaid Other | Admitting: Physician Assistant

## 2022-04-17 DIAGNOSIS — A084 Viral intestinal infection, unspecified: Secondary | ICD-10-CM | POA: Diagnosis not present

## 2022-04-17 NOTE — Progress Notes (Signed)
I have spent 5 minutes in review of e-visit questionnaire, review and updating patient chart, medical decision making and response to patient.   Azaryah Heathcock Cody Cendy Oconnor, PA-C    

## 2022-04-17 NOTE — Progress Notes (Signed)
We are sorry that you are not feeling well.  Here is how we plan to help!  Based on what you have shared with me it looks like you have Acute Infectious Diarrhea.  Most cases of acute diarrhea are due to infections with virus and bacteria and are self-limited conditions lasting less than 14 days.  For your symptoms you may take Imodium 2 mg tablets that are over the counter at your local pharmacy. Take two tablet now and then one after each loose stool up to 6 a day.  Antibiotics are not needed for most people with diarrhea.   HOME CARE We recommend changing your diet to help with your symptoms for the next few days. Drink plenty of fluids that contain water salt and sugar. Sports drinks such as Gatorade may help.  You may try broths, soups, bananas, applesauce, soft breads, mashed potatoes or crackers.  You are considered infectious for as long as the diarrhea continues. Hand washing or use of alcohol based hand sanitizers is recommend. It is best to stay out of work or school until your symptoms stop.   GET HELP RIGHT AWAY If you have dark yellow colored urine or do not pass urine frequently you should drink more fluids.   If your symptoms worsen  If you feel like you are going to pass out (faint) You have a new problem  MAKE SURE YOU  Understand these instructions. Will watch your condition. Will get help right away if you are not doing well or get worse.  Thank you for choosing an e-visit.  Your e-visit answers were reviewed by a board certified advanced clinical practitioner to complete your personal care plan. Depending upon the condition, your plan could have included both over the counter or prescription medications.  Please review your pharmacy choice. Make sure the pharmacy is open so you can pick up prescription now. If there is a problem, you may contact your provider through MyChart messaging and have the prescription routed to another pharmacy.  Your safety is important  to us. If you have drug allergies check your prescription carefully.   For the next 24 hours you can use MyChart to ask questions about today's visit, request a non-urgent call back, or ask for a work or school excuse. You will get an email in the next two days asking about your experience. I hope that your e-visit has been valuable and will speed your recovery.  

## 2022-04-19 ENCOUNTER — Telehealth: Payer: Self-pay

## 2022-04-19 NOTE — Telephone Encounter (Signed)
Copied from CRM [id number]. Topic: General - Inquiry >> Apr 19, 2022  4:15 PM De Blanch wrote: Reason for CRM: Pt stated she received a notification via MyChart that a list of her medications had been sent to her cardiologist. It was on [facility] MyChart from [facility].  Pt stated she wanted to know why this was sent and if something was wrong.

## 2022-04-21 ENCOUNTER — Telehealth: Payer: Medicaid Other | Admitting: Family Medicine

## 2022-04-21 DIAGNOSIS — A084 Viral intestinal infection, unspecified: Secondary | ICD-10-CM

## 2022-04-21 NOTE — Progress Notes (Signed)
04/21/22  Please excuse Fabiha Brendlinger from work 04/17/22- 04/18/22.  Thank you ,  Georgana Curio, FNP

## 2022-04-21 NOTE — Progress Notes (Signed)
Hi Ms Salido. I will approve a note for 04/17/22 and 04/18/22 for viral gastroenteritis as you were seen 04/17/22. I am glad you are feeling better.  Thank you, Burnetta Sabin, FNP

## 2022-04-21 NOTE — Progress Notes (Signed)
Please excue

## 2022-04-28 ENCOUNTER — Ambulatory Visit (INDEPENDENT_AMBULATORY_CARE_PROVIDER_SITE_OTHER): Payer: Medicaid Other | Admitting: Physician Assistant

## 2022-04-28 ENCOUNTER — Encounter: Payer: Self-pay | Admitting: Physician Assistant

## 2022-04-28 ENCOUNTER — Ambulatory Visit: Payer: Medicaid Other | Admitting: Family Medicine

## 2022-04-28 VITALS — BP 147/88 | HR 78 | Temp 97.8°F | Resp 16 | Wt 191.8 lb

## 2022-04-28 DIAGNOSIS — I10 Essential (primary) hypertension: Secondary | ICD-10-CM

## 2022-04-28 NOTE — Progress Notes (Signed)
I,Loreta Blouch Robinson,acting as a Neurosurgeon for [employer], PA-C.,have documented all relevant documentation on the behalf of [name], PA-C,as directed by  [employer], PA-C while in the presence of [employer], PA-C.   Established patient visit   Patient: Gabrielle Owens   DOB: 1971-04-19   51 y.o. Female  MRN: [id number] Visit Date: 04/28/2022  Today's healthcare provider: [name], PA-C   Chief Complaint  Patient presents with   Hypertension   Subjective    HPI  Hypertension, follow-up  BP Readings from Last 3 Encounters:  04/28/22 (!) 147/88  04/12/22 140/80  03/21/22 (!) 163/88   Wt Readings from Last 3 Encounters:  04/28/22 191 lb 12.8 oz (87 kg)  04/12/22 190 lb (86.2 kg)  03/21/22 191 lb 11.2 oz (87 kg)     She was last seen for hypertension 2 weeks ago.  BP at that visit was 140/80. Management since that visit includes Add Amlodipine 5 mg in the evening. Patient quit all caffeine and increased water.  She reports excellent compliance with treatment. She is not having side effects.  She is following a Low Sodium diet. She is not exercising. She does not smoke.  Use of agents associated with hypertension: none.   Outside blood pressures are: Patient has log at visit today. Less than 140/90 in the morning  More than 140/90 in the evening Has Cardiology more than 6 mo ago. She has been having problems with herniated disk. Will plan for surgery. Was scheduled for PT in August  Wants to lose weight and starts on DM and weight loss medication at the same time  Pertinent labs Lab Results  Component Value Date   CHOL 198 09/20/2021   HDL 34 (L) 09/20/2021   LDLCALC 112 (H) 09/20/2021   TRIG 300 (H) 09/20/2021   CHOLHDL 5.8 (H) 09/20/2021   Lab Results  Component Value Date   NA 133 (L) 01/27/2022   K 3.2 (L) 01/27/2022   CREATININE 0.51 01/27/2022   GFRNONAA >60 01/27/2022   GLUCOSE 179 (H) 01/27/2022   TSH 1.548 01/27/2022     The  10-year ASCVD risk score (Arnett DK, et al., 2019) is: 8%  ---------------------------------------------------------------------------------------------------     Medications: Outpatient Medications Prior to Visit  Medication Sig   ALPRAZolam (XANAX) 0.5 MG tablet Take 1 tablet (0.5 mg total) by mouth every 8 (eight) hours as needed.   amLODipine (NORVASC) 5 MG tablet Take 1 tablet (5 mg total) by mouth every evening.   aspirin 81 MG tablet Take 81 mg by mouth daily.   escitalopram (LEXAPRO) 10 MG tablet TAKE 1 TABLET BY MOUTH AT BEDTIME   gabapentin (NEURONTIN) 300 MG capsule Take 300 mg by mouth 5 (five) times daily.   glipiZIDE (GLUCOTROL) 5 MG tablet TAKE 1 TABLET BY MOUTH DAILY BEFORE BREAKFAST FOR 14 DAYS   hydrochlorothiazide (HYDRODIURIL) 25 MG tablet TAKE 1 TABLET BY MOUTH DAILY   metFORMIN (GLUCOPHAGE) 1000 MG tablet TAKE ONE (1) TABLET BY MOUTH TWO TIMES PER DAY WITH A MEAL   metoprolol (LOPRESSOR) 100 MG tablet Take 1 tablet by mouth 2 (two) times daily. Taking 2 1/2 Tablets daily   PROAIR HFA 108 (90 Base) MCG/ACT inhaler    simvastatin (ZOCOR) 20 MG tablet TAKE ONE TABLET EACH NIGHT   tiZANidine (ZANAFLEX) 4 MG tablet Take by mouth.   traMADol (ULTRAM) 50 MG tablet Take 1 tablet (50 mg total) by mouth every 8 (eight) hours as needed.   valsartan (  DIOVAN) 160 MG tablet Take 1 tablet (160 mg total) by mouth daily.   nitroGLYCERIN (NITROSTAT) 0.4 MG SL tablet Place under the tongue.   No facility-administered medications prior to visit.    Review of Systems  Respiratory:  Negative for shortness of breath.   Cardiovascular:  Positive for palpitations.  Neurological:  Negative for headaches.       Objective    BP (!) 147/88 (BP Location: Left Arm, Patient Position: Sitting, Cuff Size: Normal)   Pulse 78   Temp 97.8 F (36.6 C) (Oral)   Resp 16   Wt 191 lb 12.8 oz (87 kg)   SpO2 97%   BMI 35.08 kg/m    Physical Exam Vitals reviewed.  Constitutional:       General: She is not in acute distress.    Appearance: Normal appearance. She is well-developed. She is not diaphoretic.  HENT:     Head: Normocephalic and atraumatic.  Eyes:     General: No scleral icterus.    Conjunctiva/sclera: Conjunctivae normal.  Neck:     Thyroid: No thyromegaly.  Cardiovascular:     Rate and Rhythm: Normal rate and regular rhythm.     Pulses: Normal pulses.     Heart sounds: Normal heart sounds. No murmur heard. Pulmonary:     Effort: Pulmonary effort is normal. No respiratory distress.     Breath sounds: Normal breath sounds. No wheezing, rhonchi or rales.  Musculoskeletal:     Cervical back: Neck supple.     Right lower leg: No edema.     Left lower leg: No edema.  Lymphadenopathy:     Cervical: No cervical adenopathy.  Skin:    General: Skin is warm and dry.     Findings: No rash.  Neurological:     Mental Status: She is alert and oriented to person, place, and time. Mental status is at baseline.  Psychiatric:        Mood and Affect: Mood normal.        Behavior: Behavior normal.      No results found for any visits on 04/28/22.  Assessment & Plan     1. Essential (primary) hypertension BP today 147/88, outside varies from 110 to 145 to 170/once - CBC with Differential/Platelet - Comprehensive metabolic panel - TSH - Lipid Profile Might consider EKG if high BP persists Continue low sodium diet and daily exercise/swimming FU in a week  The patient was advised to call back or seek an in-person evaluation if the symptoms worsen or if the condition fails to improve as anticipated.  I discussed the assessment and treatment plan with the patient. The patient was provided an opportunity to ask questions and all were answered. The patient agreed with the plan and demonstrated an understanding of the instructions.  The entirety of the information documented in the History of Present Illness, Review of Systems and Physical Exam were personally  obtained by me. Portions of this information were initially documented by the CMA and reviewed by me for thoroughness and accuracy.  Portions of this note were created using dictation software and may contain typographical errors.     [name], PA-C  [facility] [phone] (phone) [phone] (fax)  [facility]

## 2022-04-29 LAB — COMPREHENSIVE METABOLIC PANEL
ALT: 268 IU/L — ABNORMAL HIGH (ref 0–32)
AST: 149 IU/L — ABNORMAL HIGH (ref 0–40)
Albumin/Globulin Ratio: 1.6 (ref 1.2–2.2)
Albumin: 4.2 g/dL (ref 3.8–4.9)
Alkaline Phosphatase: 111 IU/L (ref 44–121)
BUN/Creatinine Ratio: 24 — ABNORMAL HIGH (ref 9–23)
BUN: 14 mg/dL (ref 6–24)
Bilirubin Total: 0.4 mg/dL (ref 0.0–1.2)
CO2: 22 mmol/L (ref 20–29)
Calcium: 9.9 mg/dL (ref 8.7–10.2)
Chloride: 96 mmol/L (ref 96–106)
Creatinine, Ser: 0.59 mg/dL (ref 0.57–1.00)
Globulin, Total: 2.6 g/dL (ref 1.5–4.5)
Glucose: 339 mg/dL — ABNORMAL HIGH (ref 70–99)
Potassium: 4.5 mmol/L (ref 3.5–5.2)
Sodium: 134 mmol/L (ref 134–144)
Total Protein: 6.8 g/dL (ref 6.0–8.5)
eGFR: 109 mL/min/{1.73_m2} (ref >59)

## 2022-04-29 LAB — CBC WITH DIFFERENTIAL/PLATELET
Basophils Absolute: 0.1 10*3/uL (ref 0.0–0.2)
Basos: 1 %
EOS (ABSOLUTE): 0.3 10*3/uL (ref 0.0–0.4)
Eos: 4 %
Hematocrit: 40.4 % (ref 34.0–46.6)
Hemoglobin: 13.8 g/dL (ref 11.1–15.9)
Immature Grans (Abs): 0 10*3/uL (ref 0.0–0.1)
Immature Granulocytes: 1 %
Lymphocytes Absolute: 2.4 10*3/uL (ref 0.7–3.1)
Lymphs: 31 %
MCH: 31.1 pg (ref 26.6–33.0)
MCHC: 34.2 g/dL (ref 31.5–35.7)
MCV: 91 fL (ref 79–97)
Monocytes Absolute: 0.7 10*3/uL (ref 0.1–0.9)
Monocytes: 9 %
Neutrophils Absolute: 4.2 10*3/uL (ref 1.4–7.0)
Neutrophils: 54 %
Platelets: 352 10*3/uL (ref 150–450)
RBC: 4.44 x10E6/uL (ref 3.77–5.28)
RDW: 11.9 % (ref 11.7–15.4)
WBC: 7.8 10*3/uL (ref 3.4–10.8)

## 2022-04-29 LAB — LIPID PANEL
Chol/HDL Ratio: 6 ratio — ABNORMAL HIGH (ref 0.0–4.4)
Cholesterol, Total: 174 mg/dL (ref 100–199)
HDL: 29 mg/dL — ABNORMAL LOW (ref >39)
LDL Chol Calc (NIH): 74 mg/dL (ref 0–99)
Triglycerides: 448 mg/dL — ABNORMAL HIGH (ref 0–149)
VLDL Cholesterol Cal: 71 mg/dL — ABNORMAL HIGH (ref 5–40)

## 2022-04-29 LAB — TSH: TSH: 1.49 u[IU]/mL (ref 0.450–4.500)

## 2022-05-01 ENCOUNTER — Other Ambulatory Visit: Payer: Self-pay

## 2022-05-01 ENCOUNTER — Encounter: Payer: Self-pay | Admitting: Physician Assistant

## 2022-05-01 ENCOUNTER — Emergency Department: Payer: Medicaid Other

## 2022-05-01 ENCOUNTER — Emergency Department
Admission: EM | Admit: 2022-05-01 | Discharge: 2022-05-01 | Disposition: A | Discharge status: Home / Self Care | Payer: Medicaid Other | Attending: Emergency Medicine | Admitting: Emergency Medicine

## 2022-05-01 DIAGNOSIS — E119 Type 2 diabetes mellitus without complications: Secondary | ICD-10-CM | POA: Diagnosis not present

## 2022-05-01 DIAGNOSIS — R7401 Elevation of levels of liver transaminase levels: Secondary | ICD-10-CM | POA: Diagnosis not present

## 2022-05-01 DIAGNOSIS — Z7982 Long term (current) use of aspirin: Secondary | ICD-10-CM | POA: Insufficient documentation

## 2022-05-01 DIAGNOSIS — I2510 Atherosclerotic heart disease of native coronary artery without angina pectoris: Secondary | ICD-10-CM | POA: Diagnosis not present

## 2022-05-01 DIAGNOSIS — R002 Palpitations: Secondary | ICD-10-CM | POA: Diagnosis not present

## 2022-05-01 DIAGNOSIS — R000 Tachycardia, unspecified: Secondary | ICD-10-CM

## 2022-05-01 DIAGNOSIS — Z8616 Personal history of COVID-19: Secondary | ICD-10-CM | POA: Diagnosis not present

## 2022-05-01 DIAGNOSIS — K700 Alcoholic fatty liver: Secondary | ICD-10-CM | POA: Diagnosis not present

## 2022-05-01 DIAGNOSIS — I10 Essential (primary) hypertension: Secondary | ICD-10-CM | POA: Insufficient documentation

## 2022-05-01 DIAGNOSIS — Z7984 Long term (current) use of oral hypoglycemic drugs: Secondary | ICD-10-CM | POA: Diagnosis not present

## 2022-05-01 DIAGNOSIS — R079 Chest pain, unspecified: Secondary | ICD-10-CM | POA: Diagnosis not present

## 2022-05-01 DIAGNOSIS — Z79899 Other long term (current) drug therapy: Secondary | ICD-10-CM | POA: Diagnosis not present

## 2022-05-01 DIAGNOSIS — K760 Fatty (change of) liver, not elsewhere classified: Secondary | ICD-10-CM

## 2022-05-01 LAB — HEPATIC FUNCTION PANEL
ALT: 220 U/L — ABNORMAL HIGH (ref 0–44)
AST: 155 U/L — ABNORMAL HIGH (ref 15–41)
Albumin: 3.6 g/dL (ref 3.5–5.0)
Alkaline Phosphatase: 82 U/L (ref 38–126)
Bilirubin, Direct: 0.1 mg/dL (ref 0.0–0.2)
Indirect Bilirubin: 0.6 mg/dL (ref 0.3–0.9)
Total Bilirubin: 0.7 mg/dL (ref 0.3–1.2)
Total Protein: 7.1 g/dL (ref 6.5–8.1)

## 2022-05-01 LAB — TROPONIN I (HIGH SENSITIVITY)
Troponin I (High Sensitivity): 5 ng/L (ref <18)
Troponin I (High Sensitivity): 4 ng/L (ref <18)

## 2022-05-01 LAB — CBC
HCT: 39.7 % (ref 36.0–46.0)
Hemoglobin: 13.5 g/dL (ref 12.0–15.0)
MCH: 30.7 pg (ref 26.0–34.0)
MCHC: 34 g/dL (ref 30.0–36.0)
MCV: 90.2 fL (ref 80.0–100.0)
Platelets: 301 10*3/uL (ref 150–400)
RBC: 4.4 MIL/uL (ref 3.87–5.11)
RDW: 12.3 % (ref 11.5–15.5)
WBC: 8.3 10*3/uL (ref 4.0–10.5)
nRBC: 0 % (ref 0.0–0.2)

## 2022-05-01 LAB — D-DIMER, QUANTITATIVE: D-Dimer, Quant: 0.31 ug/mL-FEU (ref 0.00–0.50)

## 2022-05-01 LAB — MAGNESIUM: Magnesium: 1.8 mg/dL (ref 1.7–2.4)

## 2022-05-01 LAB — BASIC METABOLIC PANEL
Anion gap: 12 (ref 5–15)
BUN: 12 mg/dL (ref 6–20)
CO2: 23 mmol/L (ref 22–32)
Calcium: 9 mg/dL (ref 8.9–10.3)
Chloride: 98 mmol/L (ref 98–111)
Creatinine, Ser: 0.47 mg/dL (ref 0.44–1.00)
GFR, Estimated: >60 mL/min (ref >60)
Glucose, Bld: 251 mg/dL — ABNORMAL HIGH (ref 70–99)
Potassium: 3.6 mmol/L (ref 3.5–5.1)
Sodium: 133 mmol/L — ABNORMAL LOW (ref 135–145)

## 2022-05-01 LAB — TSH: TSH: 2.243 u[IU]/mL (ref 0.350–4.500)

## 2022-05-01 LAB — HCG, QUANTITATIVE, PREGNANCY: hCG, Beta Chain, Quant, S: <1 m[IU]/mL (ref <5)

## 2022-05-01 LAB — LIPASE, BLOOD: Lipase: 26 U/L (ref 11–51)

## 2022-05-01 NOTE — Discharge Instructions (Addendum)
Please follow-up with Dr. Darrold Junker as scheduled next week.  Also, please follow-up with your primary care doctor this Friday as you already have plans.

## 2022-05-01 NOTE — ED Provider Notes (Signed)
[facility] Provider Note    Event Date/Time   First MD Initiated Contact with Patient 05/01/22 [phone]     (approximate)   History   Tachycardia   HPI  Gabrielle Owens Patient is a 51 y.o. 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  States over the past few weeks she has noticed that her blood pressure fluctuates significantly despite being compliant with her Toprol twice daily, valsartan, HCTZ and amlodipine.  She states her blood pressure has been as high as 180s/100s but then later in the day it will be 110s/70s.  She also reports that morning she woke up from sleep with palpitations and checked her heart rate and it was 95.  States she stood up and it went up to 110.  She walked to the bathroom and it was 130.  She denies that it felt irregular.  She denies that she had chest pain or shortness of breath with this.  She states she has cut out caffeine and has increased her water intake.  She only drinks 1 diet soda a day.  She denies any over-the-counter stimulants such as decongestants or energy drinks.  She denies any illicit drug use.  No history of PE or DVT.  Her significant other at the bedside states that she has been under a lot of stress and has anxiety.  No recent fevers, cough, vomiting, diarrhea, bloody stools, melena.   She does state that she was recently seen by her primary care provider and had routine blood work that showed elevated AST and ALT but no complaints of abdominal pain.  Has had a history of cholelithiasis.  Patient had a low risk stress test in December 2019.  Has an appointment with Dr. Darrold Junker next week.  History provided by patient and significant other.    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 Negative 06/19/2014. Normal Colposcopy. Repeat pap in 1 year per GYN.   Coronary artery disease    COVID-19    once in 2021 and once in January 2022   Diabetes mellitus without complication (HCC)    Hyperlipidemia    Hypertension     No past surgical history on file.  MEDICATIONS:  Prior to Admission medications   Medication Sig Start Date End Date Taking? Authorizing Provider  ALPRAZolam Prudy Feeler) 0.5 MG tablet Take 1 tablet (0.5 mg total) by mouth every 8 (eight) hours as needed. 02/08/22   Malva Limes, MD  amLODipine (NORVASC) 5 MG tablet Take 1 tablet (5 mg total) by mouth every evening. 04/12/22   Maple Hudson., MD  aspirin 81 MG tablet Take 81 mg by mouth daily.    [provider]  escitalopram (LEXAPRO) 10 MG tablet TAKE 1 TABLET BY MOUTH AT BEDTIME 04/03/22   Malva Limes, MD  gabapentin (NEURONTIN) 300 MG capsule Take 300 mg by mouth 5 (five) times daily.    [provider]  glipiZIDE (GLUCOTROL) 5 MG tablet TAKE 1 TABLET BY MOUTH DAILY BEFORE BREAKFAST FOR 14 DAYS 01/20/22   Malva Limes, MD  hydrochlorothiazide (HYDRODIURIL) 25 MG tablet TAKE 1 TABLET BY MOUTH DAILY 02/21/22  Malva Limes, MD  metFORMIN (GLUCOPHAGE) 1000 MG tablet TAKE ONE (1) TABLET BY MOUTH TWO TIMES PER DAY WITH A MEAL 11/22/21   Malva Limes, MD  metoprolol (LOPRESSOR) 100 MG tablet Take 1 tablet by mouth 2 (two) times daily. Taking 2 1/2 Tablets daily 05/31/12   [provider]  nitroGLYCERIN (NITROSTAT) 0.4 MG SL tablet Place under the tongue. 01/06/21 01/06/22  [provider]  PROAIR HFA 108 [phone] Base) MCG/ACT inhaler  09/23/15   [provider]  simvastatin (ZOCOR) 20 MG tablet TAKE ONE TABLET EACH NIGHT 02/03/22   Malva Limes, MD  tiZANidine (ZANAFLEX) 4 MG tablet Take by mouth.  08/30/21 08/30/22  [provider]  traMADol (ULTRAM) 50 MG tablet Take 1 tablet (50 mg total) by mouth every 8 (eight) hours as needed. 01/16/22   Malva Limes, MD  valsartan (DIOVAN) 160 MG tablet Take 1 tablet (160 mg total) by mouth daily. 03/21/22   Malva Limes, MD    Physical Exam   Triage Vital Signs: ED Triage Vitals [05/01/22 0617]  Enc Vitals Group     BP (!) 153/96     Pulse Rate 86     Resp 16     Temp 98.4 F (36.9 C)     Temp Source Oral     SpO2 97 %     Weight 191 lb (86.6 kg)     Height 5\' 2"  (1.575 m)     Head Circumference      Peak Flow      Pain Score      Pain Loc      Pain Edu?      Excl. in GC?     Most recent vital signs: Vitals:   05/01/22 0617 05/01/22 0700  BP: (!) 153/96 124/79  Pulse: 86 70  Resp: 16 12  Temp: 98.4 F (36.9 C)   SpO2: 97% 98%    CONSTITUTIONAL: Alert and oriented and responds appropriately to questions. Well-appearing; well-nourished HEAD: Normocephalic, atraumatic EYES: Conjunctivae clear, pupils appear equal, sclera nonicteric ENT: normal nose; moist mucous membranes NECK: Supple, normal ROM CARD: RRR; S1 and S2 appreciated; no murmurs, no clicks, no rubs, no gallops RESP: Normal chest excursion without splinting or tachypnea; breath sounds clear and equal bilaterally; no wheezes, no rhonchi, no rales, no hypoxia or respiratory distress, speaking full sentences ABD/GI: Normal bowel sounds; non-distended; soft, non-tender, no rebound, no guarding, no peritoneal signs BACK: The back appears normal EXT: Normal ROM in all joints; no deformity noted, no edema; no cyanosis, no calf tenderness or calf swelling SKIN: Normal color for age and race; warm; no rash on exposed skin NEURO: Moves all extremities equally, normal speech PSYCH: The patient's mood and manner are appropriate.  Patient becomes tearful during our conversation.   ED Results / Procedures / Treatments   LABS: (all labs ordered are listed,  but only abnormal results are displayed) Labs Reviewed  CBC  BASIC METABOLIC PANEL  MAGNESIUM  TSH  HCG, QUANTITATIVE, PREGNANCY  D-DIMER, QUANTITATIVE  HEPATIC FUNCTION PANEL  LIPASE, BLOOD  TROPONIN I (HIGH SENSITIVITY)     EKG:  EKG Interpretation  Date/Time:  Monday May 01 2022 06:24:31 EDT Ventricular Rate:  80 PR Interval:  186 QRS Duration: 87 QT Interval:  387 QTC Calculation: 447 R Axis:   49 Text Interpretation: Sinus rhythm Low voltage, precordial leads Consider anterior infarct No significant change since last tracing Confirmed by [id number] [phone]) on 05/01/2022  6:53:02 AM         RADIOLOGY: My personal review and interpretation of imaging: Chest x-ray clear.  Right upper quadrant ultrasound pending.  I have personally reviewed all radiology reports.   DG Chest Portable 1 View  Result Date: 05/01/2022 CLINICAL DATA:  51 year old female with history of chest pain and palpitations. EXAM: PORTABLE CHEST 1 VIEW COMPARISON:  Chest x-ray 01/27/2022. FINDINGS: Lung volumes are normal. No consolidative airspace disease. No pleural effusions. No pneumothorax. No pulmonary nodule or mass noted. Pulmonary vasculature and the cardiomediastinal silhouette are within normal limits. IMPRESSION: No radiographic evidence of acute cardiopulmonary disease. Electronically Signed   By: Trudie Reed M.D.   On: 05/01/2022 07:03     PROCEDURES:  Critical Care performed: No     .1-3 Lead EKG Interpretation  Performed by: Palestine Mosco, Layla Maw, DO Authorized by: Tiana Sivertson, Layla Maw, DO     Interpretation: normal     ECG rate:  86   ECG rate assessment: normal     Rhythm: sinus rhythm     Ectopy: none     Conduction: normal       IMPRESSION / MDM / ASSESSMENT AND PLAN / ED COURSE  I reviewed the triage vital signs and the nursing notes.    Patient with intermittent chest tightness and today with palpitations and a heart rate in the 130s.  Also reports recent  fluctuating blood pressures.  Significant other also states she has been very anxious.  The patient is on the cardiac monitor to evaluate for evidence of arrhythmia and/or significant heart rate changes.   DIFFERENTIAL DIAGNOSIS (includes but not limited to):   ACS, PE, less likely dissection, CHF, pneumonia, pneumothorax.  Differential also includes arrhythmia, anemia, electrolyte derangement, dehydration, anxiety, perimenopause, thyroid dysfunction.   Patient's presentation is most consistent with acute presentation with potential threat to life or bodily function.   PLAN: We will obtain CBC, BMP, troponin x2, magnesium, TSH, D-dimer, chest x-ray.  EKG is nonischemic without arrhythmia and heart rate is currently in the 70s to 80s in a normal sinus rhythm.  We will add on LFTs, lipase is for history of elevated liver function test at her primary care doctor visit on 04/28/2022.  Will obtain right upper quadrant ultrasound as well.   MEDICATIONS GIVEN IN ED: Medications - No data to display   ED COURSE: Patient's labs show normal hemoglobin, platelets, no leukocytosis.  The rest of her blood work is pending.  Chest x-ray reviewed and interpreted by myself and radiology and shows no infiltrate, edema, pneumothorax, widened mediastinum or cardiomegaly.   Signed out to oncoming ED physician at 7 AM.  CONSULTS: Dispo pending lab work, imaging.   OUTSIDE RECORDS REVIEWED: Reviewed patient's last stress test in December 2019 and previous admission and cardiac catheterization in February 2014.       FINAL CLINICAL IMPRESSION(S) / ED DIAGNOSES   Final diagnoses:  Chest pain, unspecified type  Palpitations  Tachycardia     Rx / DC Orders   ED Discharge Orders     None        Note:  This document was prepared using Dragon voice recognition software and may include unintentional dictation errors.   Kash Davie, Layla Maw, DO 05/01/22 [phone]

## 2022-05-01 NOTE — ED Triage Notes (Signed)
Pt states sensation of tachycardia this am. Pt states she was up to 130 while ambulating. Pt states she also believes her blood pressure is high. Pt denies chest pain or shob.

## 2022-05-01 NOTE — ED Provider Notes (Signed)
  LFTs in keeping with previous check.  Patient advised her doctor is working her up for this.  Denies associated right upper quadrant pain.  Right upper quadrant ultrasound   US ABDOMEN LIMITED RUQ (LIVER/GB)  Result Date: 05/01/2022 CLINICAL DATA:  Elevated LFTs. EXAM: ULTRASOUND ABDOMEN LIMITED RIGHT UPPER QUADRANT COMPARISON:  CT abdomen and pelvis without contrast 03/25/2012 FINDINGS: Gallbladder: There is a stone within the gallbladder fundus measuring up to approximately 3.0 cm. A stone was seen on the prior remote 03/25/2012 CT measuring up to 1.6 cm. There are multiple echogenic foci within the gallbladder wall with comet tail artifact suggesting possible mild adenomyomatosis. No gallbladder wall thickening. No pericholecystic fluid. Negative sonographic Murphy's sign. Common bile duct: Diameter: 3 mm, within normal limits. Liver: Increased echogenicity and attenuation of the liver suggesting fatty infiltration as seen on prior CT. Smooth liver contours. No focal liver lesion is seen. Portal vein is patent on color Doppler imaging with normal direction of blood flow towards the liver. Other: None. IMPRESSION: 1. Cholelithiasis.  No sonographic evidence of acute cholecystitis. 2. Mild adenomyomatosis, a benign finding. 3. Fatty infiltration of the liver. Electronically Signed   By: Neita Garnet M.D.   On: 05/01/2022 08:57   DG Chest Portable 1 View  Result Date: 05/01/2022 CLINICAL DATA:  51 year old female with history of chest pain and palpitations. EXAM: PORTABLE CHEST 1 VIEW COMPARISON:  Chest x-ray 01/27/2022. FINDINGS: Lung volumes are normal. No consolidative airspace disease. No pleural effusions. No pneumothorax. No pulmonary nodule or mass noted. Pulmonary vasculature and the cardiomediastinal silhouette are within normal limits. IMPRESSION: No radiographic evidence of acute cardiopulmonary disease. Electronically Signed   By: Trudie Reed M.D.   On: 05/01/2022 07:03       Basic  metabolic panel within normal range with exception to very mild hyponatremia, likely associated with mild pseudohyponatremia from elevated glucose'  Initial troponin normal.  Thyroid testing normal  ----------------------------------------- 9:33 AM on 05/01/2022 -----------------------------------------   Return precautions and treatment recommendations and follow-up discussed with the patient who is agreeable with the plan.  Patient asymptomatic.  Normal vital signs.  Comfortable plan, plans to see her primary care doctor this Friday and will see cardiology next week.     Sharyn Creamer, MD 05/01/22 [phone]

## 2022-05-01 NOTE — ED Provider Notes (Signed)
Patient resting comfortably at this time with her husband the bedside.  Her D-dimer is exclusionary for pulmonary embolism is low risk patient.  CBC normal.  Chest x-ray interpreted by me as normal chest x-ray without acute finding.   Sharyn Creamer, MD 05/01/22 [phone]

## 2022-05-02 ENCOUNTER — Telehealth: Payer: Self-pay

## 2022-05-02 ENCOUNTER — Other Ambulatory Visit: Payer: Self-pay | Admitting: Physician Assistant

## 2022-05-02 DIAGNOSIS — I10 Essential (primary) hypertension: Secondary | ICD-10-CM

## 2022-05-02 MED ORDER — AMLODIPINE BESYLATE 10 MG PO TABS: 10.0000 mg | ORAL_TABLET | Freq: Every day | ORAL | 1 refills | Status: DC

## 2022-05-02 NOTE — Telephone Encounter (Signed)
Copied from CRM [id number]. Topic: General - Other >> May 02, 2022  1:23 PM Everette C wrote: Reason for CRM: The patient has recently had their Amlodipine prescription increased by Elease Etienne to 10 MG  The patient has been directed by their Pharmacy to contact their Primary Care Provider and request a new prescription with the increased dosage   Please contact the patient further to confirm submission of the prescription

## 2022-05-02 NOTE — Telephone Encounter (Signed)
I don't see where the dosage of amlodipine was increased during office visit from 04/28/2022. Please review and advise on medication request.

## 2022-05-03 DIAGNOSIS — M5136 Other intervertebral disc degeneration, lumbar region: Secondary | ICD-10-CM | POA: Diagnosis not present

## 2022-05-03 DIAGNOSIS — M5126 Other intervertebral disc displacement, lumbar region: Secondary | ICD-10-CM | POA: Diagnosis not present

## 2022-05-03 DIAGNOSIS — M5416 Radiculopathy, lumbar region: Secondary | ICD-10-CM | POA: Diagnosis not present

## 2022-05-04 DIAGNOSIS — G8929 Other chronic pain: Secondary | ICD-10-CM | POA: Diagnosis not present

## 2022-05-04 DIAGNOSIS — M5441 Lumbago with sciatica, right side: Secondary | ICD-10-CM | POA: Diagnosis not present

## 2022-05-04 NOTE — Progress Notes (Signed)
Established patient visit  I,Gabrielle Owens,acting as a scribe for Gabrielle Sachs, PA-C.,have documented all relevant documentation on the behalf of Gabrielle Speak, PA-C,as directed by  Gabrielle Sachs, PA-C while in the presence of Gabrielle Sachs, PA-C.   Patient: Gabrielle Owens   DOB: 12-10-1970   51 y.o. Female  MRN: [id number] Visit Date: 05/05/2022  Today's healthcare provider: [name], PA-C   Chief Complaint  Patient presents with   Follow-up chronic disease   Subjective    Hypertension, follow-up  BP Readings from Last 3 Encounters:  05/05/22 127/86  05/01/22 137/87  04/28/22 (!) 147/88   Wt Readings from Last 3 Encounters:  05/05/22 186 lb 8 oz (84.6 kg)  05/01/22 191 lb (86.6 kg)  04/28/22 191 lb 12.8 oz (87 kg)     She was last seen for hypertension 1 weeks ago.  BP at that visit was 147/88. Management since that visit includes: Might consider EKG if high BP persists Continue low sodium diet and daily exercise/swimming.  She reports excellent compliance with treatment. She is not having side effects.  She is following a  Well balanced  diet. She is not exercising. She does not smoke.   Outside blood pressures are 130's/85-140's/80's. Symptoms: No chest pain No chest pressure  Yes palpitations No syncope  No dyspnea No orthopnea  No paroxysmal nocturnal dyspnea No lower extremity edema   Pertinent labs Lab Results  Component Value Date   CHOL 174 04/28/2022   HDL 29 (L) 04/28/2022   LDLCALC 74 04/28/2022   TRIG 448 (H) 04/28/2022   CHOLHDL 6.0 (H) 04/28/2022   Lab Results  Component Value Date   NA 133 (L) 05/01/2022   K 3.6 05/01/2022   CREATININE 0.47 05/01/2022   GFRNONAA >60 05/01/2022   GLUCOSE 251 (H) 05/01/2022   TSH 2.243 05/01/2022     The 10-year ASCVD risk score (Arnett DK, et al., 2019) is: 6.1%  --------------------------------------------------------------------------------------------------- Weight management: Patient  would like to lose weight and if possible get on an injection to help with weight loss and sugar levels.  Reports that her fasting sugars reading at home are 200's-296.Eye exam is past due.  Medications: Outpatient Medications Prior to Visit  Medication Sig   ALPRAZolam (XANAX) 0.5 MG tablet Take 1 tablet (0.5 mg total) by mouth every 8 (eight) hours as needed.   amLODipine (NORVASC) 10 MG tablet Take 1 tablet (10 mg total) by mouth daily.   aspirin 81 MG tablet Take 81 mg by mouth daily.   escitalopram (LEXAPRO) 10 MG tablet TAKE 1 TABLET BY MOUTH AT BEDTIME   gabapentin (NEURONTIN) 300 MG capsule Take 300 mg by mouth 5 (five) times daily.   hydrochlorothiazide (HYDRODIURIL) 25 MG tablet TAKE 1 TABLET BY MOUTH DAILY   metFORMIN (GLUCOPHAGE) 1000 MG tablet TAKE ONE (1) TABLET BY MOUTH TWO TIMES PER DAY WITH A MEAL   metoprolol (LOPRESSOR) 100 MG tablet Take 1 tablet by mouth 2 (two) times daily. Taking 2 1/2 Tablets daily   PROAIR HFA 108 (90 Base) MCG/ACT inhaler    simvastatin (ZOCOR) 20 MG tablet TAKE ONE TABLET EACH NIGHT   tiZANidine (ZANAFLEX) 4 MG tablet Take by mouth.   traMADol (ULTRAM) 50 MG tablet Take 1 tablet (50 mg total) by mouth every 8 (eight) hours as needed.   valsartan (DIOVAN) 160 MG tablet Take 1 tablet (160 mg total) by mouth daily.   [DISCONTINUED] glipiZIDE (GLUCOTROL) 5 MG tablet TAKE 1 TABLET BY  MOUTH DAILY BEFORE BREAKFAST FOR 14 DAYS   nitroGLYCERIN (NITROSTAT) 0.4 MG SL tablet Place under the tongue.   No facility-administered medications prior to visit.    Review of Systems  All other systems reviewed and are negative. Except see HPI     Objective    BP 127/86 (BP Location: Left Arm, Patient Position: Sitting, Cuff Size: Large)   Pulse 89   Temp 98.4 F (36.9 C) (Oral)   Resp 16   Ht 5\' 2"  (1.575 m)   Wt 186 lb 8 oz (84.6 kg)   LMP 04/17/2022   BMI 34.11 kg/m    Physical Exam Vitals reviewed.  Constitutional:      General: She is not in  acute distress.    Appearance: Normal appearance. She is well-developed. She is obese. She is not diaphoretic.  HENT:     Head: Normocephalic and atraumatic.     Nose: Nose normal.  Eyes:     General: No scleral icterus.    Conjunctiva/sclera: Conjunctivae normal.  Neck:     Thyroid: No thyromegaly.  Cardiovascular:     Rate and Rhythm: Normal rate and regular rhythm.     Pulses: Normal pulses.     Heart sounds: Normal heart sounds. No murmur heard. Pulmonary:     Effort: Pulmonary effort is normal. No respiratory distress.     Breath sounds: Normal breath sounds. No wheezing, rhonchi or rales.  Musculoskeletal:     Cervical back: Neck supple.     Right lower leg: No edema.     Left lower leg: No edema.  Lymphadenopathy:     Cervical: No cervical adenopathy.  Skin:    General: Skin is warm and dry.     Findings: No rash.  Neurological:     Mental Status: She is alert and oriented to person, place, and time. Mental status is at baseline.  Psychiatric:        Behavior: Behavior normal.        Thought Content: Thought content normal.        Judgment: Judgment normal.      No results found for any visits on 05/05/22.  Assessment & Plan    1. Essential (primary) hypertension BP today was 127/86, at goal Continue her current regimen  2. Hypertriglyceridemia High TGL and low HDL OTC omega-3 recommended Lifestyle modification are highly advised Weight control advised  3. Diabetes mellitus with no complication (HCC) Blood sugar from 201 to 326 Continue Metformin - glipiZIDE (GLUCOTROL) 5 MG tablet; Take 1 tablet (5 mg total) by mouth daily before breakfast.  Dispense: 30 tablet; Refill: 0 - Semaglutide,0.25 or 0.5MG /DOS, (OZEMPIC, 0.25 OR 0.5 MG/DOSE,) 2 MG/3ML SOPN; Inject 0.25 mg into the skin once a week.  Dispense: 2 mL; Refill: 0  4. Elevated liver enzymes Weight control advised - glipiZIDE (GLUCOTROL) 5 MG tablet; Take 1 tablet (5 mg total) by mouth daily before  breakfast.  Dispense: 30 tablet; Refill: 0 - Semaglutide,0.25 or 0.5MG /DOS, (OZEMPIC, 0.25 OR 0.5 MG/DOSE,) 2 MG/3ML SOPN; Inject 0.25 mg into the skin once a week.  Dispense: 2 mL; Refill: 0 - Acute Hep Panel & Hep B Surface Ab  Recent 07/05/22 RUQ showed cholelithiasis, steatosis and mild adenomyomatosis Healthy diet recommended, daily exercise advised  The patient was advised to call back or seek an in-person evaluation if the symptoms worsen or if the condition fails to improve as anticipated.  I discussed the assessment and treatment plan with the patient. The patient  was provided an opportunity to ask questions and all were answered. The patient agreed with the plan and demonstrated an understanding of the instructions.  The entirety of the information documented in the History of Present Illness, Review of Systems and Physical Exam were personally obtained by me. Portions of this information were initially documented by the CMA and reviewed by me for thoroughness and accuracy.  Portions of this note were created using dictation software and may contain typographical errors.        Total encounter time more than 30 minutes  Greater than 50% was spent in counseling and coordination of care with the patient  Cherlynn Polo  [facility] [phone] (phone) [phone] (fax)  [facility]

## 2022-05-05 ENCOUNTER — Ambulatory Visit: Payer: Medicaid Other | Admitting: Physician Assistant

## 2022-05-05 ENCOUNTER — Telehealth: Payer: Self-pay | Admitting: Family Medicine

## 2022-05-05 ENCOUNTER — Encounter: Payer: Self-pay | Admitting: Physician Assistant

## 2022-05-05 VITALS — BP 127/86 | HR 89 | Temp 98.4°F | Resp 16 | Ht 62.0 in | Wt 186.5 lb

## 2022-05-05 DIAGNOSIS — E781 Pure hyperglyceridemia: Secondary | ICD-10-CM

## 2022-05-05 DIAGNOSIS — I10 Essential (primary) hypertension: Secondary | ICD-10-CM | POA: Diagnosis not present

## 2022-05-05 DIAGNOSIS — E119 Type 2 diabetes mellitus without complications: Secondary | ICD-10-CM

## 2022-05-05 DIAGNOSIS — R748 Abnormal levels of other serum enzymes: Secondary | ICD-10-CM | POA: Diagnosis not present

## 2022-05-05 MED ORDER — GLIPIZIDE 5 MG PO TABS: 5.0000 mg | ORAL_TABLET | Freq: Every day | ORAL | 0 refills | Status: DC

## 2022-05-05 MED ORDER — OZEMPIC (0.25 OR 0.5 MG/DOSE) 2 MG/3ML ~~LOC~~ SOPN: 0.2500 mg | PEN_INJECTOR | SUBCUTANEOUS | 0 refills | Status: DC

## 2022-05-05 NOTE — Telephone Encounter (Signed)
Please Review and advise 

## 2022-05-05 NOTE — Telephone Encounter (Signed)
Pt is calling to report that her insurance did approve her Semaglutide,0.25 or 0.5MG /DOS, (OZEMPIC, 0.25 OR 0.5 MG/DOSE,) 2 MG/3ML SOPN [[id number]] . Pt is wanting know when to stop her other diabetic meds and how to take the ozempic? CB_ [phone]

## 2022-05-05 NOTE — Telephone Encounter (Signed)
Patient reached out to provider via mychart. Closing this CRM since there's another message and we don't have multiple.

## 2022-05-06 LAB — ACUTE HEP PANEL AND HEP B SURFACE AB
Hep A IgM: NEGATIVE
Hep B C IgM: NEGATIVE
Hep C Virus Ab: NONREACTIVE
Hepatitis B Surf Ab Quant: 25.1 m[IU]/mL (ref >9.9)
Hepatitis B Surface Ag: NEGATIVE

## 2022-05-08 NOTE — Telephone Encounter (Signed)
Message given to pt. Pt stated she know how to give injections (daughter is a Type 1 diabetic)

## 2022-05-08 NOTE — Telephone Encounter (Signed)
Okay or do you still want office visit?

## 2022-05-09 ENCOUNTER — Other Ambulatory Visit: Payer: Self-pay | Admitting: Family Medicine

## 2022-05-09 DIAGNOSIS — I10 Essential (primary) hypertension: Secondary | ICD-10-CM | POA: Diagnosis not present

## 2022-05-09 DIAGNOSIS — R002 Palpitations: Secondary | ICD-10-CM | POA: Diagnosis not present

## 2022-05-09 DIAGNOSIS — I2510 Atherosclerotic heart disease of native coronary artery without angina pectoris: Secondary | ICD-10-CM | POA: Diagnosis not present

## 2022-05-09 DIAGNOSIS — E785 Hyperlipidemia, unspecified: Secondary | ICD-10-CM | POA: Diagnosis not present

## 2022-05-11 DIAGNOSIS — M5441 Lumbago with sciatica, right side: Secondary | ICD-10-CM | POA: Diagnosis not present

## 2022-05-11 DIAGNOSIS — G8929 Other chronic pain: Secondary | ICD-10-CM | POA: Diagnosis not present

## 2022-05-18 NOTE — Progress Notes (Signed)
I,Gabrielle Owens,acting as a Neurosurgeon for [employer], PA-C.,have documented all relevant documentation on the behalf of Gabrielle Lat, PA-C,as directed by  [employer], PA-C while in the presence of [employer], PA-C.   Established patient visit   Patient: Gabrielle Owens   DOB: [birth year]   51 y.o. Female  MRN: [id number] Visit Date: 05/19/2022  Today's healthcare provider: [name], PA-C   Chief Complaint  Patient presents with   Diabetes   Subjective    Diabetes Mellitus Type II, Follow-up  Lab Results  Component Value Date   HGBA1C 7.1 (A) 03/21/2022   HGBA1C 6.5 (A) 09/20/2021   HGBA1C 6.1 (A) 08/25/2020   Wt Readings from Last 3 Encounters:  05/19/22 187 lb (84.8 kg)  05/05/22 186 lb 8 oz (84.6 kg)  05/01/22 191 lb (86.6 kg)   Last seen for diabetes 2 weeks ago.  Management since then includes:  Continue Metformin - glipiZIzide 5 MG tablet; Take 1 tablet (5 mg total) by mouth daily before breakfast. - Semaglutide,0.25 or 0.5MG /DOS, 2 MG/3ML SOPN; Inject 0.25 mg into the skin once a week.. She reports excellent compliance with treatment. She is not having side effects.  Symptoms: No fatigue No foot ulcerations  No appetite changes No nausea  No paresthesia of the feet  No polydipsia  No polyuria No visual disturbances   No vomiting     Home blood sugar records: fasting range: average 140  Episodes of hypoglycemia? No  Current insulin regiment:  Most Recent Eye Exam: 01/15/20 Current exercise: PT  Current diet habits: in general, a "healthy" diet   Was seen by Cardiology  and switch to Metoprolol succinate from tartrate  Pertinent Labs: Lab Results  Component Value Date   CHOL 174 04/28/2022   HDL 29 (L) 04/28/2022   LDLCALC 74 04/28/2022   TRIG 448 (H) 04/28/2022   CHOLHDL 6.0 (H) 04/28/2022   Lab Results  Component Value Date   NA 133 (L) 05/01/2022   K 3.6 05/01/2022   CREATININE 0.47 05/01/2022   GFRNONAA >60 05/01/2022   LABMICR  <3.0 01/16/2022     ---------------------------------------------------------------------------------------------------   Medications: Outpatient Medications Prior to Visit  Medication Sig   ALPRAZolam (XANAX) 0.5 MG tablet Take 1 tablet (0.5 mg total) by mouth every 8 (eight) hours as needed.   amLODipine (NORVASC) 10 MG tablet Take 1 tablet (10 mg total) by mouth daily.   aspirin 81 MG tablet Take 81 mg by mouth daily.   escitalopram (LEXAPRO) 10 MG tablet TAKE 1 TABLET BY MOUTH AT BEDTIME   gabapentin (NEURONTIN) 300 MG capsule Take 300 mg by mouth 5 (five) times daily.   glipiZIDE (GLUCOTROL) 5 MG tablet Take 1 tablet (5 mg total) by mouth daily before breakfast.   hydrochlorothiazide (HYDRODIURIL) 25 MG tablet TAKE 1 TABLET BY MOUTH DAILY   metFORMIN (GLUCOPHAGE) 1000 MG tablet TAKE ONE (1) TABLET BY MOUTH TWO TIMES PER DAY WITH A MEAL   metoprolol (LOPRESSOR) 100 MG tablet Take 1 tablet by mouth 2 (two) times daily. Taking 2 1/2 Tablets daily   PROAIR HFA 108 (90 Base) MCG/ACT inhaler    Semaglutide,0.25 or 0.5MG /DOS, (OZEMPIC, 0.25 OR 0.5 MG/DOSE,) 2 MG/3ML SOPN Inject 0.25 mg into the skin once a week.   simvastatin (ZOCOR) 20 MG tablet TAKE ONE TABLET EACH NIGHT   tiZANidine (ZANAFLEX) 4 MG tablet Take by mouth.   traMADol (ULTRAM) 50 MG tablet Take 1 tablet (50 mg total) by mouth every 8 (  eight) hours as needed.   valsartan (DIOVAN) 160 MG tablet Take 1 tablet (160 mg total) by mouth daily.   nitroGLYCERIN (NITROSTAT) 0.4 MG SL tablet Place under the tongue.   No facility-administered medications prior to visit.    Review of Systems  All other systems reviewed and are negative. Except see HPI     Objective    BP 125/80 (BP Location: Right Arm, Patient Position: Sitting, Cuff Size: Normal)   Pulse 87   Temp 98.2 F (36.8 C) (Oral)   Resp 16   Wt 187 lb (84.8 kg)   LMP 04/17/2022   SpO2 100%   BMI 34.20 kg/m    Physical Exam Vitals reviewed.  Constitutional:       General: She is not in acute distress.    Appearance: Normal appearance. She is well-developed. She is not diaphoretic.  HENT:     Head: Normocephalic and atraumatic.  Eyes:     General: No scleral icterus.    Conjunctiva/sclera: Conjunctivae normal.  Neck:     Thyroid: No thyromegaly.  Cardiovascular:     Rate and Rhythm: Normal rate and regular rhythm.     Pulses: Normal pulses.     Heart sounds: Normal heart sounds. No murmur heard. Pulmonary:     Effort: Pulmonary effort is normal. No respiratory distress.     Breath sounds: Normal breath sounds. No wheezing, rhonchi or rales.  Musculoskeletal:     Cervical back: Neck supple.     Right lower leg: No edema.     Left lower leg: No edema.  Lymphadenopathy:     Cervical: No cervical adenopathy.  Skin:    General: Skin is warm and dry.     Findings: No rash.  Neurological:     Mental Status: She is alert and oriented to person, place, and time. Mental status is at baseline.  Psychiatric:        Mood and Affect: Mood normal.        Behavior: Behavior normal.       No results found for any visits on 05/19/22.  Assessment & Plan     1. Essential (primary) hypertension BP at goal. Pt is stable She lost 4 pounds and feels very energetic  2. Hypertriglyceridemia OTC omega-3 recommended Lifestyle modification are highly advised Congratulated pt for losing 4 pounds on her current diet   3. Diabetes mellitus with no complication (HCC) Currently, on 3 meds, will taper glipizide and continue increase ozempic dose  4. Elevated liver enzymes Her acute hep panel was negative [place] RUQ showed Recent US RUQ showed cholelithiasis, steatosis and mild adenomyomatosis Continue with lifestyle modifications and DM treatment     The patient was advised to call back or seek an in-person evaluation if the symptoms worsen or if the condition fails to improve as anticipated.  I discussed the assessment and treatment plan with the  patient. The patient was provided an opportunity to ask questions and all were answered. The patient agreed with the plan and demonstrated an understanding of the instructions.  The entirety of the information documented in the History of Present Illness, Review of Systems and Physical Exam were personally obtained by me. Portions of this information were initially documented by the CMA and reviewed by me for thoroughness and accuracy.  Portions of this note were created using dictation software and may contain typographical errors.     Gabrielle Lat, PA-C  [facility] [phone] (phone) [phone] (fax)  [facility]

## 2022-05-19 ENCOUNTER — Encounter: Payer: Self-pay | Admitting: Physician Assistant

## 2022-05-19 ENCOUNTER — Ambulatory Visit (INDEPENDENT_AMBULATORY_CARE_PROVIDER_SITE_OTHER): Payer: Medicaid Other | Admitting: Physician Assistant

## 2022-05-19 VITALS — BP 125/80 | HR 87 | Temp 98.2°F | Resp 16 | Wt 187.0 lb

## 2022-05-19 DIAGNOSIS — I10 Essential (primary) hypertension: Secondary | ICD-10-CM

## 2022-05-19 DIAGNOSIS — R748 Abnormal levels of other serum enzymes: Secondary | ICD-10-CM | POA: Diagnosis not present

## 2022-05-19 DIAGNOSIS — E119 Type 2 diabetes mellitus without complications: Secondary | ICD-10-CM

## 2022-05-19 DIAGNOSIS — E781 Pure hyperglyceridemia: Secondary | ICD-10-CM | POA: Diagnosis not present

## 2022-05-25 DIAGNOSIS — G8929 Other chronic pain: Secondary | ICD-10-CM | POA: Diagnosis not present

## 2022-05-25 DIAGNOSIS — M5441 Lumbago with sciatica, right side: Secondary | ICD-10-CM | POA: Diagnosis not present

## 2022-06-02 ENCOUNTER — Ambulatory Visit (INDEPENDENT_AMBULATORY_CARE_PROVIDER_SITE_OTHER): Payer: Medicaid Other | Admitting: Physician Assistant

## 2022-06-02 ENCOUNTER — Encounter: Payer: Self-pay | Admitting: Physician Assistant

## 2022-06-02 VITALS — BP 130/77 | HR 82 | Temp 98.5°F | Resp 14 | Wt 187.0 lb

## 2022-06-02 DIAGNOSIS — E119 Type 2 diabetes mellitus without complications: Secondary | ICD-10-CM | POA: Diagnosis not present

## 2022-06-02 NOTE — Progress Notes (Unsigned)
I,Roshena L Chambers,acting as a Neurosurgeon for [employer], PA-C.,have documented all relevant documentation on the behalf of [name], PA-C,as directed by  [employer], PA-C while in the presence of [employer], PA-C.   Established patient visit   Patient: Gabrielle Owens   DOB: Sep 23, 1971   51 y.o. Female  MRN: [id number] Visit Date: 06/02/2022  Today's healthcare provider: [name], PA-C   Chief Complaint  Patient presents with   Diabetes   Subjective    HPI  Follow up for Diabetes:  The patient was last seen for this 2  weeks  ago. Changes made at last visit include none; continue same medication.  She reports good compliance with treatment. She feels that condition is Unchanged. She is having side effects. constipation Request to transition from a dual therapy to mono with ozempic Last a1C was 7 -----------------------------------------------------------------------------------------   Medications: Outpatient Medications Prior to Visit  Medication Sig   ALPRAZolam (XANAX) 0.5 MG tablet Take 1 tablet (0.5 mg total) by mouth every 8 (eight) hours as needed.   amLODipine (NORVASC) 10 MG tablet Take 1 tablet (10 mg total) by mouth daily. (Patient taking differently: Take 7.5 mg by mouth daily.)   aspirin 81 MG tablet Take 81 mg by mouth daily.   escitalopram (LEXAPRO) 10 MG tablet TAKE 1 TABLET BY MOUTH AT BEDTIME   gabapentin (NEURONTIN) 300 MG capsule Take 600 mg by mouth 3 (three) times daily.   glipiZIDE (GLUCOTROL) 5 MG tablet Take 1 tablet (5 mg total) by mouth daily before breakfast.   hydrochlorothiazide (HYDRODIURIL) 25 MG tablet TAKE 1 TABLET BY MOUTH DAILY   metFORMIN (GLUCOPHAGE) 1000 MG tablet TAKE ONE (1) TABLET BY MOUTH TWO TIMES PER DAY WITH A MEAL   metoprolol (LOPRESSOR) 100 MG tablet Take 1 tablet by mouth 2 (two) times daily. Taking 2 1/2 Tablets daily   PROAIR HFA 108 (90 Base) MCG/ACT inhaler    Semaglutide,0.25 or 0.5MG /DOS, (OZEMPIC,  0.25 OR 0.5 MG/DOSE,) 2 MG/3ML SOPN Inject 0.25 mg into the skin once a week.   simvastatin (ZOCOR) 20 MG tablet TAKE ONE TABLET EACH NIGHT   valsartan (DIOVAN) 160 MG tablet Take 1 tablet (160 mg total) by mouth daily.   HYDROcodone-acetaminophen (NORCO/VICODIN) 5-325 MG tablet Take 1 tablet by mouth 2 (two) times daily as needed.   nitroGLYCERIN (NITROSTAT) 0.4 MG SL tablet Place under the tongue.   tiZANidine (ZANAFLEX) 4 MG tablet Take by mouth. (Patient not taking: Reported on 06/02/2022)   traMADol (ULTRAM) 50 MG tablet Take 1 tablet (50 mg total) by mouth every 8 (eight) hours as needed. (Patient not taking: Reported on 06/02/2022)   No facility-administered medications prior to visit.    Review of Systems  Constitutional:  Negative for appetite change, chills, fatigue and fever.  Respiratory:  Negative for chest tightness and shortness of breath.   Cardiovascular:  Negative for chest pain and palpitations.  Gastrointestinal:  Negative for abdominal pain, nausea and vomiting.  Neurological:  Negative for dizziness and weakness.  Except see HPI     Objective    BP 130/77 (BP Location: Left Arm, Patient Position: Sitting, Cuff Size: Large)   Pulse 82   Temp 98.5 F (36.9 C) (Oral)   Resp 14   Wt 187 lb (84.8 kg)   SpO2 99% Comment: room air  BMI 34.20 kg/m    Physical Exam Vitals reviewed.  Constitutional:      General: She is not in acute distress.  Appearance: Normal appearance. She is well-developed. She is not diaphoretic.  HENT:     Head: Normocephalic and atraumatic.  Eyes:     General: No scleral icterus.    Conjunctiva/sclera: Conjunctivae normal.  Neck:     Thyroid: No thyromegaly.  Cardiovascular:     Rate and Rhythm: Normal rate and regular rhythm.     Pulses: Normal pulses.     Heart sounds: Normal heart sounds. No murmur heard. Pulmonary:     Effort: Pulmonary effort is normal. No respiratory distress.     Breath sounds: Normal breath sounds. No  wheezing, rhonchi or rales.  Musculoskeletal:     Cervical back: Neck supple.     Right lower leg: No edema.     Left lower leg: No edema.  Lymphadenopathy:     Cervical: No cervical adenopathy.  Skin:    General: Skin is warm and dry.     Findings: No rash.  Neurological:     Mental Status: She is alert and oriented to person, place, and time. Mental status is at baseline.  Psychiatric:        Mood and Affect: Mood normal.        Behavior: Behavior normal.    Foot exam with Monofilament test normal/bilaterally     No results found for any visits on 06/02/22.  Assessment & Plan       1. Diabetes mellitus with no complication (HCC) Chronic and stable Currently on a dual therapy Benefits and risks were discussed. Patient agreed and expressed her understanding.  Will reassess if pt will qualify for a mono therapy at her next appt/will update A1C Needs a refill at the end of the next week Advised to continue measuring her BS for reassessment  CPE  in October/with PAP smear    The patient was advised to call back or seek an in-person evaluation if the symptoms worsen or if the condition fails to improve as anticipated.  I discussed the assessment and treatment plan with the patient. The patient was provided an opportunity to ask questions and all were answered. The patient agreed with the plan and demonstrated an understanding of the instructions.  The entirety of the information documented in the History of Present Illness, Review of Systems and Physical Exam were personally obtained by me. Portions of this information were initially documented by the CMA and reviewed by me for thoroughness and accuracy.   Portions of this note were created using dictation software and may contain typographical errors.     [name], PA-C  [facility] [phone] (phone) [phone] (fax)  [facility]

## 2022-06-06 ENCOUNTER — Other Ambulatory Visit: Payer: Self-pay | Admitting: Physician Assistant

## 2022-06-06 DIAGNOSIS — E119 Type 2 diabetes mellitus without complications: Secondary | ICD-10-CM

## 2022-06-06 DIAGNOSIS — R748 Abnormal levels of other serum enzymes: Secondary | ICD-10-CM

## 2022-06-09 DIAGNOSIS — G8929 Other chronic pain: Secondary | ICD-10-CM | POA: Diagnosis not present

## 2022-06-09 DIAGNOSIS — M5441 Lumbago with sciatica, right side: Secondary | ICD-10-CM | POA: Diagnosis not present

## 2022-06-11 ENCOUNTER — Other Ambulatory Visit: Payer: Self-pay | Admitting: Physician Assistant

## 2022-06-11 ENCOUNTER — Encounter: Payer: Self-pay | Admitting: Physician Assistant

## 2022-06-11 DIAGNOSIS — R748 Abnormal levels of other serum enzymes: Secondary | ICD-10-CM

## 2022-06-11 DIAGNOSIS — E119 Type 2 diabetes mellitus without complications: Secondary | ICD-10-CM

## 2022-06-12 ENCOUNTER — Other Ambulatory Visit: Payer: Self-pay | Admitting: Family Medicine

## 2022-06-12 DIAGNOSIS — I10 Essential (primary) hypertension: Secondary | ICD-10-CM

## 2022-06-13 ENCOUNTER — Other Ambulatory Visit: Payer: Self-pay | Admitting: Physician Assistant

## 2022-06-13 ENCOUNTER — Other Ambulatory Visit: Payer: Self-pay | Admitting: Family Medicine

## 2022-06-13 DIAGNOSIS — R748 Abnormal levels of other serum enzymes: Secondary | ICD-10-CM

## 2022-06-13 DIAGNOSIS — E119 Type 2 diabetes mellitus without complications: Secondary | ICD-10-CM

## 2022-06-13 MED ORDER — OZEMPIC (0.25 OR 0.5 MG/DOSE) 2 MG/3ML ~~LOC~~ SOPN: 0.5000 mg | PEN_INJECTOR | SUBCUTANEOUS | 0 refills | Status: DC

## 2022-06-13 NOTE — Telephone Encounter (Signed)
Medication Refill - Medication: Semaglutide,0.25 or 0.5MG /DOS, (OZEMPIC, 0.25 OR 0.5 MG/DOSE,) 2 MG/3ML SOPN  Pt is calling back to follow up on medication refill request. Pt has sent multiple request via MyChart as well.   Please advise.   Has the patient contacted their pharmacy? No. No, more refills.   (Agent: If no, request that the patient contact the pharmacy for the refill. If patient does not wish to contact the pharmacy document the reason why and proceed with request.)   Preferred Pharmacy (with phone number or street name):  [facility] - [place], [place] - [address]  [address] [place] [address]  Phone: [phone] Fax: [phone]  Hours: Not open 24 hours   Has the patient been seen for an appointment in the last year OR does the patient have an upcoming appointment? Yes.    Agent: Please be advised that RX refills may take up to 3 business days. We ask that you follow-up with your pharmacy.

## 2022-06-14 ENCOUNTER — Other Ambulatory Visit: Payer: Self-pay | Admitting: Physician Assistant

## 2022-06-14 DIAGNOSIS — E119 Type 2 diabetes mellitus without complications: Secondary | ICD-10-CM

## 2022-06-14 DIAGNOSIS — R748 Abnormal levels of other serum enzymes: Secondary | ICD-10-CM

## 2022-06-14 MED ORDER — OZEMPIC (0.25 OR 0.5 MG/DOSE) 2 MG/3ML ~~LOC~~ SOPN: 0.5000 mg | PEN_INJECTOR | SUBCUTANEOUS | 0 refills | Status: DC

## 2022-06-14 NOTE — Telephone Encounter (Signed)
Refused this refill request for Ozempic.    The order was signed 06/14/2022 by [name] and sent to [facility] arriving at 7:17 AM.  This looks like a duplicate request.

## 2022-06-21 LAB — HM DIABETES EYE EXAM

## 2022-07-05 DIAGNOSIS — M5416 Radiculopathy, lumbar region: Secondary | ICD-10-CM | POA: Diagnosis not present

## 2022-07-05 DIAGNOSIS — M5126 Other intervertebral disc displacement, lumbar region: Secondary | ICD-10-CM | POA: Diagnosis not present

## 2022-07-05 DIAGNOSIS — M5136 Other intervertebral disc degeneration, lumbar region: Secondary | ICD-10-CM | POA: Diagnosis not present

## 2022-07-18 ENCOUNTER — Encounter: Payer: Medicaid Other | Admitting: Physician Assistant

## 2022-07-18 ENCOUNTER — Ambulatory Visit
Admission: RE | Admit: 2022-07-18 | Discharge: 2022-07-18 | Disposition: A | Discharge status: Home / Self Care | Payer: BC Managed Care – PPO | Source: Ambulatory Visit | Attending: Physician Assistant | Admitting: Physician Assistant

## 2022-07-18 DIAGNOSIS — Z1231 Encounter for screening mammogram for malignant neoplasm of breast: Secondary | ICD-10-CM | POA: Diagnosis not present

## 2022-07-20 ENCOUNTER — Ambulatory Visit (INDEPENDENT_AMBULATORY_CARE_PROVIDER_SITE_OTHER): Payer: BC Managed Care – PPO | Admitting: Physician Assistant

## 2022-07-20 ENCOUNTER — Encounter: Payer: Self-pay | Admitting: Physician Assistant

## 2022-07-20 ENCOUNTER — Other Ambulatory Visit (HOSPITAL_COMMUNITY)
Admission: RE | Admit: 2022-07-20 | Discharge: 2022-07-20 | Disposition: A | Discharge status: Home / Self Care | Payer: BC Managed Care – PPO | Source: Ambulatory Visit | Attending: Physician Assistant | Admitting: Physician Assistant

## 2022-07-20 VITALS — BP 122/80 | HR 84 | Resp 16 | Ht 62.0 in | Wt 186.0 lb

## 2022-07-20 DIAGNOSIS — Z1211 Encounter for screening for malignant neoplasm of colon: Secondary | ICD-10-CM

## 2022-07-20 DIAGNOSIS — Z124 Encounter for screening for malignant neoplasm of cervix: Secondary | ICD-10-CM

## 2022-07-20 DIAGNOSIS — E119 Type 2 diabetes mellitus without complications: Secondary | ICD-10-CM

## 2022-07-20 DIAGNOSIS — Z0000 Encounter for general adult medical examination without abnormal findings: Secondary | ICD-10-CM | POA: Diagnosis not present

## 2022-07-20 DIAGNOSIS — Z114 Encounter for screening for human immunodeficiency virus [HIV]: Secondary | ICD-10-CM | POA: Diagnosis not present

## 2022-07-20 DIAGNOSIS — Z23 Encounter for immunization: Secondary | ICD-10-CM

## 2022-07-20 DIAGNOSIS — E049 Nontoxic goiter, unspecified: Secondary | ICD-10-CM

## 2022-07-20 NOTE — Progress Notes (Signed)
I,April Miller,acting as a Neurosurgeon for [employer], PA-C.,have documented all relevant documentation on the behalf of Gabrielle Lat, PA-C,as directed by  [employer], PA-C while in the presence of [employer], PA-C.   Complete physical exam   Patient: Gabrielle Owens   DOB: 12-Aug-1971   51 y.o. Female  MRN: [id number] Visit Date: 07/20/2022  Today's healthcare provider: [name], PA-C   Chief Complaint  Patient presents with   Annual Exam   Subjective    Gabrielle Owens is a 51 y.o. female who presents today for a complete physical exam.   HPI   Pt does not have any complaints today Adheres to healthy diet and exercise routine.  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 Negative 06/19/2014. Normal Colposcopy. Repeat pap in 1 year per GYN.   Coronary artery disease    COVID-19    once in 2021 and once in January 2022   Diabetes mellitus without complication (HCC)    Hyperlipidemia    Hypertension    History reviewed. No pertinent surgical history. Social History   Socioeconomic History   Marital status: Married    Spouse name: Not on file   Number of children: Not on file   Years of education: Not on file   Highest education level: Not on file  Occupational History   Not on file  Tobacco Use   Smoking status: Former    Packs/day: 1.00    Years: 24.00    Total pack years: 24.00    Types: Cigarettes    Quit date: 03/03/2011    Years since quitting: 11.3   Smokeless tobacco: Never  Substance and Sexual Activity   Alcohol use: Yes    Alcohol/week: 0.0 standard drinks of alcohol    Comment: occasional   Drug use: No   Sexual activity: Yes  Other Topics Concern   Not on file  Social History Narrative   ** Merged History Encounter **       Social Determinants of Health   Financial Resource  Strain: Not on file  Food Insecurity: Not on file  Transportation Needs: Not on file  Physical Activity: Not on file  Stress: Not on file  Social Connections: Not on file  Intimate Partner Violence: Not on file   Family Status  Relation Name Status   Mother  Alive   Father  Alive   Emelda Brothers  Deceased at age 51       cancer   Sister  Alive   Brother  Alive   MGM  Deceased at age 25       MI   MGF  Deceased at age 55       cause of death: old age   PGF  Deceased at age 24   Family History  Problem Relation Age of Onset   Hypertension Mother    Heart attack Father 82       mid 62's   Diabetes Father        type 2   Hypertension Father    COPD Father    Chronic bronchitis Father    Breast cancer Paternal Aunt    Cancer Paternal Aunt        breast   Diabetes Sister    Heart attack Maternal Grandmother    Prostate cancer Paternal Grandfather  Cancer Paternal Grandfather 28       prostate   Heart attack Paternal Grandfather    No Known Allergies  Patient Care Team: Malva Limes, MD as PCP - General (Family Medicine) Malva Limes, MD (Family Medicine) Pa, [facility] (Ophthalmology)   Medications: Outpatient Medications Prior to Visit  Medication Sig   ALPRAZolam (XANAX) 0.5 MG tablet Take 1 tablet (0.5 mg total) by mouth every 8 (eight) hours as needed.   amLODipine (NORVASC) 10 MG tablet Take 1 tablet (10 mg total) by mouth daily. (Patient taking differently: Take 7.5 mg by mouth daily.)   aspirin 81 MG tablet Take 81 mg by mouth daily.   escitalopram (LEXAPRO) 10 MG tablet TAKE 1 TABLET BY MOUTH AT BEDTIME   gabapentin (NEURONTIN) 300 MG capsule Take 600 mg by mouth 3 (three) times daily.   glipiZIDE (GLUCOTROL) 5 MG tablet TAKE ONE TABLET BY MOUTH EVERY DAY WITH BREAKFAST   hydrochlorothiazide (HYDRODIURIL) 25 MG tablet TAKE 1 TABLET BY MOUTH DAILY   metFORMIN (GLUCOPHAGE) 1000 MG tablet TAKE ONE (1) TABLET BY MOUTH TWO TIMES PER DAY WITH A  MEAL   Metoprolol Succinate 100 MG CS24 Take by mouth in the morning and at bedtime.   PROAIR HFA 108 (90 Base) MCG/ACT inhaler    Semaglutide,0.25 or 0.5MG /DOS, (OZEMPIC, 0.25 OR 0.5 MG/DOSE,) 2 MG/3ML SOPN Inject 0.5 mg into the skin once a week.   simvastatin (ZOCOR) 20 MG tablet TAKE ONE TABLET EACH NIGHT   valsartan (DIOVAN) 160 MG tablet TAKE 1 TABLET BY MOUTH DAILY THIS REPLACES LOSARTAN   [DISCONTINUED] metoprolol (LOPRESSOR) 100 MG tablet Take 1 tablet by mouth 2 (two) times daily. Taking 2 1/2 Tablets daily   HYDROcodone-acetaminophen (NORCO/VICODIN) 5-325 MG tablet Take 1 tablet by mouth 2 (two) times daily as needed. (Patient not taking: Reported on 07/20/2022)   nitroGLYCERIN (NITROSTAT) 0.4 MG SL tablet Place under the tongue.   No facility-administered medications prior to visit.    Review of Systems  All other systems reviewed and are negative. Except See HPI    Objective    BP 122/80 (BP Location: Right Arm, Patient Position: Sitting, Cuff Size: Normal)   Pulse 84   Resp 16   Ht 5\' 2"  (1.575 m)   Wt 186 lb (84.4 kg)   SpO2 97%   BMI 34.02 kg/m     Physical Exam Vitals reviewed.  Constitutional:      General: She is not in acute distress.    Appearance: Normal appearance. She is well-developed. She is not diaphoretic.  HENT:     Head: Normocephalic and atraumatic.     Right Ear: Tympanic membrane, ear canal and external ear normal.     Left Ear: Tympanic membrane, ear canal and external ear normal.     Nose: Nose normal.     Mouth/Throat:     Mouth: Mucous membranes are moist.     Pharynx: Oropharynx is clear. No oropharyngeal exudate.  Eyes:     General: No scleral icterus.    Conjunctiva/sclera: Conjunctivae normal.     Pupils: Pupils are equal, round, and reactive to light.  Neck:     Thyroid: No thyromegaly.  Cardiovascular:     Rate and Rhythm: Normal rate and regular rhythm.     Pulses: Normal pulses.     Heart sounds: Normal heart sounds. No  murmur heard. Pulmonary:     Effort: Pulmonary effort is normal. No respiratory distress.  Breath sounds: Normal breath sounds. No wheezing or rales.  Abdominal:     General: There is no distension.     Palpations: Abdomen is soft.     Tenderness: There is no abdominal tenderness.  Genitourinary:    Comments:  General: Normal vulva.     Exam position: Lithotomy position.     Pubic Area: No rash or pubic lice.      Tanner stage (genital): 5.     Labia:        Right: No rash, tenderness, lesion or injury.        Left: No rash, tenderness, lesion or injury.      Vagina: Vaginal discharge, erythema and tenderness present.     Cervix: Discharge present. No cervical motion tenderness, lesion, cervical bleeding or eryhema.     Uterus: Normal.      Adnexa: Right adnexa normal and left adnexa normal.     Musculoskeletal:        General: No deformity.     Cervical back: Neck supple.     Right lower leg: No edema.     Left lower leg: No edema.  Lymphadenopathy:     Cervical: No cervical adenopathy.  Skin:    General: Skin is warm and dry.     Findings: No rash.  Neurological:     Mental Status: She is alert and oriented to person, place, and time. Mental status is at baseline.     Sensory: No sensory deficit.     Motor: No weakness.     Gait: Gait normal.  Psychiatric:        Mood and Affect: Mood normal.        Behavior: Behavior normal.        Thought Content: Thought content normal.      Last depression screening scores    07/20/2022    2:15 PM 03/21/2022    8:21 AM 09/20/2021    8:32 AM  PHQ 2/9 Scores  PHQ - 2 Score 0 1 3  PHQ- 9 Score 0 5 8   Last fall risk screening    03/21/2022    8:21 AM  Fall Risk   Falls in the past year? 1  Number falls in past yr: 0  Injury with Fall? 0  Follow up Falls evaluation completed   Last Audit-C alcohol use screening    07/20/2022    2:15 PM  Alcohol Use Disorder Test (AUDIT)  1. How often do you have a drink  containing alcohol? 0  2. How many drinks containing alcohol do you have on a typical day when you are drinking? 0  3. How often do you have six or more drinks on one occasion? 0  AUDIT-C Score 0   A score of 3 or more in women, and 4 or more in men indicates increased risk for alcohol abuse, EXCEPT if all of the points are from question 1   No results found for any visits on 07/20/22.  Assessment & Plan    Routine Health Maintenance and Physical Exam  Exercise Activities and Dietary recommendations  Goals     Weight loss of 5% of pt's current weight via healthy diet and daily exercise encouraged.     Immunization History  Administered Date(s) Administered   H1N1 07/30/2008   Influenza Split 07/30/2008   Influenza,inj,Quad PF,6+ Mos 07/12/2017, 08/05/2018, 08/25/2020   Pneumococcal Polysaccharide-23 07/12/2017   Tdap 05/31/2012    Health Maintenance  Topic Date Due  COLONOSCOPY (Pts 45-92yrs Insurance coverage will need to be confirmed)  Never done   OPHTHALMOLOGY EXAM  01/14/2021   TETANUS/TDAP  05/31/2022   Zoster Vaccines- Shingrix (1 of 2) 09/01/2022 (Originally 08/15/2020)   COVID-19 Vaccine (1) 01/03/2023 (Originally 02/13/1972)   INFLUENZA VACCINE  06/01/2023 (Originally 05/02/2022)   HEMOGLOBIN A1C  09/20/2022   Diabetic kidney evaluation - Urine ACR  01/17/2023   Diabetic kidney evaluation - GFR measurement  05/02/2023   PAP SMEAR-Modifier  05/02/2023   FOOT EXAM  06/03/2023   MAMMOGRAM  07/18/2024   Hepatitis C Screening  Completed   HIV Screening  Completed   HPV VACCINES  Aged Out    Discussed health benefits of physical activity, and encouraged her to engage in regular exercise appropriate for her age and condition.  1. Annual physical exam UTD on dental/eye Things to do to keep yourself healthy  - Exercise at least 30-45 minutes a day, 3-4 days a week.  - Eat a low-fat diet with lots of fruits and vegetables, up to 7-9 servings per day.  - Seatbelts  can save your life. Wear them always.  - Smoke detectors on every level of your home, check batteries every year.  - Eye Doctor - have an eye exam every 1-2 years  - Safe sex - if you may be exposed to STDs, use a condom.  - Alcohol -  If you drink, do it moderately, less than 2 drinks per day.  - [place]. Choose someone to speak for you if you are not able.  - Depression is common in our stressful world.If you're feeling down or losing interest in things you normally enjoy, please come in for a visit.  - Violence - If anyone is threatening or hurting you, please call immediately.   2. Diabetes mellitus with no complication (HCC) Chronic and stable Continue current medication Will need to refill medication in 2 weeks. Might try to increase dose? Her recent TSH wnl  3. Need for prophylactic vaccination with tetanus-diphtheria (Td)  - Tdap vaccine greater than or equal to 7yo IM  Screening for HIV (human immunodeficiency virus) Pt Declined.  Colon cancer screening Per pt, no positive family hx of colon cancer encouraged to proceed with colonoscopy or cologuard if she will agree  Encounter for screening for cervical cancer  - Cytology - PAP Pt refused to proceed with STD screening including screening for BV and yeast infection. The patient was advised to call back or seek an in-person evaluation if the symptoms worsen or if the condition fails to improve as anticipated.  Goiter Symptomatic? Currently, on ozempic Needs US thyroid  Obesity Stable weight Pt is on ozempic and follows lifestyle modifications. Might need to increase ozempic dose but need to assess thyroid first.  I discussed the assessment and treatment plan with the patient. The patient was provided an opportunity to ask questions and all were answered. The patient agreed with the plan and demonstrated an understanding of the instructions.  The entirety of the information documented in the  History of Present Illness, Review of Systems and Physical Exam were personally obtained by me. Portions of this information were initially documented by the CMA and reviewed by me for thoroughness and accuracy.  Portions of this note were created using dictation software and may contain typographical errors.    [name], PA-C  [facility] [phone] (phone) [phone] (fax)  [place]

## 2022-07-25 ENCOUNTER — Other Ambulatory Visit: Payer: Self-pay | Admitting: Physician Assistant

## 2022-07-25 DIAGNOSIS — B3731 Acute candidiasis of vulva and vagina: Secondary | ICD-10-CM

## 2022-07-25 LAB — CYTOLOGY - PAP
Chlamydia: NEGATIVE
Comment: NEGATIVE
Comment: NEGATIVE
Comment: NEGATIVE
Comment: NEGATIVE
Comment: NORMAL
Diagnosis: UNDETERMINED — AB
HSV1: NEGATIVE
HSV2: NEGATIVE
High risk HPV: POSITIVE — AB
Neisseria Gonorrhea: NEGATIVE
Trichomonas: NEGATIVE

## 2022-07-25 MED ORDER — FLUCONAZOLE 150 MG PO TABS: ORAL_TABLET | ORAL | 1 refills | Status: DC

## 2022-07-25 NOTE — Progress Notes (Signed)
Spoke with pt . Explained her results and recommendations: to place a referral to ObGyn vs repeat Pap smear with HPV/16/18 in a year. Pt reported that she has a positive pap smear in 2009 and colposcopy/biopsy? was done. The afterwards results were negative. At this point, we discussed that it is logical to repeat pap smear with hpv in a year. Pt was informed that medication for yeast infection will be sent to her pharmacy

## 2022-07-31 ENCOUNTER — Other Ambulatory Visit: Payer: Self-pay | Admitting: Physician Assistant

## 2022-07-31 ENCOUNTER — Encounter: Payer: Self-pay | Admitting: Physician Assistant

## 2022-07-31 DIAGNOSIS — E119 Type 2 diabetes mellitus without complications: Secondary | ICD-10-CM

## 2022-07-31 DIAGNOSIS — R748 Abnormal levels of other serum enzymes: Secondary | ICD-10-CM

## 2022-08-01 ENCOUNTER — Other Ambulatory Visit: Payer: Self-pay | Admitting: Physician Assistant

## 2022-08-01 DIAGNOSIS — E119 Type 2 diabetes mellitus without complications: Secondary | ICD-10-CM

## 2022-08-01 MED ORDER — OZEMPIC (0.25 OR 0.5 MG/DOSE) 2 MG/3ML ~~LOC~~ SOPN: 0.5000 mg | PEN_INJECTOR | SUBCUTANEOUS | 1 refills | Status: DC

## 2022-08-01 MED ORDER — SEMAGLUTIDE (1 MG/DOSE) 4 MG/3ML ~~LOC~~ SOPN: 1.0000 mg | PEN_INJECTOR | SUBCUTANEOUS | 1 refills | Status: DC

## 2022-08-02 ENCOUNTER — Ambulatory Visit: Admission: RE | Admit: 2022-08-02 | Payer: BC Managed Care – PPO | Source: Ambulatory Visit

## 2022-08-02 ENCOUNTER — Telehealth: Payer: Self-pay

## 2022-08-02 NOTE — Telephone Encounter (Signed)
Copied from [place] [id number]. Topic: General - Inquiry >> Aug 02, 2022  3:02 PM Marcellus Scott wrote: Reason for CRM: Maudie Mercury from [facility] is calling to let PCP know that the patient did not show up for her ultrasound today. >> Aug 02, 2022  3:04 PM Marcellus Scott wrote: Thyroid ultrasound

## 2022-08-07 NOTE — Telephone Encounter (Signed)
Aware. Patient contacted [place] through e-mail.

## 2022-08-22 ENCOUNTER — Other Ambulatory Visit: Payer: Self-pay | Admitting: Family Medicine

## 2022-08-22 DIAGNOSIS — E119 Type 2 diabetes mellitus without complications: Secondary | ICD-10-CM

## 2022-08-22 NOTE — Telephone Encounter (Signed)
Requested Prescriptions  Pending Prescriptions Disp Refills   metFORMIN (GLUCOPHAGE) 1000 MG tablet [Pharmacy Med Name: METFORMIN HCL 1000 MG TAB] 180 tablet 0    Sig: TAKE ONE TABLET TWICE A DAY WITH MEALS     Endocrinology:  Diabetes - Biguanides Failed - 08/22/2022  3:13 PM      Failed - B12 Level in normal range and within 720 days    No results found for: "VITAMINB12"       Passed - Cr in normal range and within 360 days    Creatinine  Date Value Ref Range Status  11/21/2012 0.48 (L) 0.60 - 1.30 mg/dL Final   Creatinine, Ser  Date Value Ref Range Status  05/01/2022 0.47 0.44 - 1.00 mg/dL Final         Passed - HBA1C is between 0 and 7.9 and within 180 days    Hemoglobin A1C  Date Value Ref Range Status  03/21/2022 7.1 (A) 4.0 - 5.6 % Final  11/20/2012 7.6 (H) 4.2 - 6.3 % Final    Comment:    The American Diabetes Association recommends that a primary goal of therapy should be <7% and that physicians should reevaluate the treatment regimen in patients with HbA1c values consistently >8%.    Hgb A1c MFr Bld  Date Value Ref Range Status  05/01/2018 6.4 (H) 4.8 - 5.6 % Final    Comment:             Prediabetes: 5.7 - 6.4          Diabetes: >6.4          Glycemic control for adults with diabetes: <7.0          Passed - eGFR in normal range and within 360 days    EGFR (African American)  Date Value Ref Range Status  11/21/2012 >60  Final   GFR calc Af Amer  Date Value Ref Range Status  05/01/2018 128 >59 mL/min/1.73 Final   EGFR (Non-African Amer.)  Date Value Ref Range Status  11/21/2012 >60  Final    Comment:    eGFR values <29m/min/1.73 m2 may be an indication of chronic kidney disease (CKD). Calculated eGFR is useful in patients with stable renal function. The eGFR calculation will not be reliable in acutely ill patients when serum creatinine is changing rapidly. It is not useful in  patients on dialysis. The eGFR calculation may not be applicable to  patients at the low and high extremes of body sizes, pregnant women, and vegetarians. POTASSIUM - Slight hemolysis, interpret results with  - caution...tpl METB - SPECIMEN WAS ULTRAFUGED...TPL    GFR, Estimated  Date Value Ref Range Status  05/01/2022 >60 >60 mL/min Final    Comment:    (NOTE) Calculated using the CKD-EPI Creatinine Equation (2021)    eGFR  Date Value Ref Range Status  04/28/2022 109 >59 mL/min/1.73 Final         Passed - Valid encounter within last 6 months    Recent Outpatient Visits           1 month ago [facility] [facility]   2 months ago Diabetes mellitus with no complication [place]   [facility] [place] [facility]   3 months ago Essential (primary) hypertension   [facility] [facility] J[facility] PA-C   3 months ago Essential (primary) hypertension   [facility] [facility] [place] PA-C   3 months ago Essential (primary) hypertension  [facility] [place], [place], PA-C              Passed - CBC within normal limits and completed in the last 12 months    WBC  Date Value Ref Range Status  05/01/2022 8.3 4.0 - 10.5 K/uL Final   RBC  Date Value Ref Range Status  05/01/2022 4.40 3.87 - 5.11 MIL/uL Final   Hemoglobin  Date Value Ref Range Status  05/01/2022 13.5 12.0 - 15.0 g/dL Final  04/28/2022 13.8 11.1 - 15.9 g/dL Final   HCT  Date Value Ref Range Status  05/01/2022 39.7 36.0 - 46.0 % Final   Hematocrit  Date Value Ref Range Status  04/28/2022 40.4 34.0 - 46.6 % Final   MCHC  Date Value Ref Range Status  05/01/2022 34.0 30.0 - 36.0 g/dL Final   [facility]  Date Value Ref Range Status  05/01/2022 30.7 26.0 - 34.0 pg Final   MCV  Date Value Ref Range Status  05/01/2022 90.2 80.0 - 100.0 fL Final  04/28/2022 91 79 - 97 fL Final  11/21/2012 94 80 - 100 fL Final   No results found for: "PLTCOUNTKUC", "LABPLAT", "POCPLA" RDW  Date  Value Ref Range Status  05/01/2022 12.3 11.5 - 15.5 % Final  04/28/2022 11.9 11.7 - 15.4 % Final  11/21/2012 14.1 11.5 - 14.5 % Final

## 2022-08-29 ENCOUNTER — Other Ambulatory Visit: Payer: Self-pay | Admitting: Physician Assistant

## 2022-08-29 DIAGNOSIS — E119 Type 2 diabetes mellitus without complications: Secondary | ICD-10-CM

## 2022-08-29 NOTE — Telephone Encounter (Signed)
Requested Prescriptions  Pending Prescriptions Disp Refills   OZEMPIC, 1 MG/DOSE, 4 MG/3ML SOPN [Pharmacy Med Name: OZEMPIC (1 MG/DOSE) 4 MG/3ML SUBQ S] 3 mL 1    Sig: INJECT 1MG  AS DIRECTED ONCE A WEEK     Endocrinology:  Diabetes - GLP-1 Receptor Agonists - semaglutide Failed - 08/29/2022 11:13 AM      Failed - HBA1C in normal range and within 180 days    Hemoglobin A1C  Date Value Ref Range Status  03/21/2022 7.1 (A) 4.0 - 5.6 % Final  11/20/2012 7.6 (H) 4.2 - 6.3 % Final    Comment:    The American Diabetes Association recommends that a primary goal of therapy should be <7% and that physicians should reevaluate the treatment regimen in patients with HbA1c values consistently >8%.    Hgb A1c MFr Bld  Date Value Ref Range Status  05/01/2018 6.4 (H) 4.8 - 5.6 % Final    Comment:             Prediabetes: 5.7 - 6.4          Diabetes: >6.4          Glycemic control for adults with diabetes: <7.0          Passed - Cr in normal range and within 360 days    Creatinine  Date Value Ref Range Status  11/21/2012 0.48 (L) 0.60 - 1.30 mg/dL Final   Creatinine, Ser  Date Value Ref Range Status  05/01/2022 0.47 0.44 - 1.00 mg/dL Final         Passed - Valid encounter within last 6 months    Recent Outpatient Visits           1 month ago Annual physical exam   [facility] [place], [place], PA-C   2 months ago Diabetes mellitus with no complication (HCC)   [facility] [place], [place], PA-C   3 months ago Essential (primary) hypertension   [place], [place], PA-C   3 months ago Essential (primary) hypertension   [place], [place], PA-C   4 months ago Essential (primary) hypertension   [place], [place], [place]

## 2022-09-26 ENCOUNTER — Other Ambulatory Visit: Payer: Self-pay | Admitting: Family Medicine

## 2022-09-26 DIAGNOSIS — E119 Type 2 diabetes mellitus without complications: Secondary | ICD-10-CM

## 2022-09-26 MED ORDER — OZEMPIC (1 MG/DOSE) 4 MG/3ML ~~LOC~~ SOPN: PEN_INJECTOR | SUBCUTANEOUS | 0 refills | Status: DC

## 2022-10-11 ENCOUNTER — Other Ambulatory Visit: Payer: Self-pay | Admitting: Family Medicine

## 2022-10-11 DIAGNOSIS — F419 Anxiety disorder, unspecified: Secondary | ICD-10-CM

## 2022-10-12 ENCOUNTER — Encounter: Payer: Self-pay | Admitting: Physician Assistant

## 2022-10-12 ENCOUNTER — Ambulatory Visit: Payer: BC Managed Care – PPO | Admitting: Physician Assistant

## 2022-10-12 ENCOUNTER — Other Ambulatory Visit: Payer: Self-pay

## 2022-10-12 VITALS — BP 133/88 | HR 77 | Ht 62.0 in | Wt 165.9 lb

## 2022-10-12 DIAGNOSIS — R748 Abnormal levels of other serum enzymes: Secondary | ICD-10-CM

## 2022-10-12 DIAGNOSIS — E119 Type 2 diabetes mellitus without complications: Secondary | ICD-10-CM

## 2022-10-12 DIAGNOSIS — I10 Essential (primary) hypertension: Secondary | ICD-10-CM

## 2022-10-12 DIAGNOSIS — E781 Pure hyperglyceridemia: Secondary | ICD-10-CM

## 2022-10-12 NOTE — Progress Notes (Signed)
Established patient visit   Patient: Gabrielle Owens   DOB: 03-12-1971   52 y.o. Female  MRN: [id number] Visit Date: 10/12/2022  Today's healthcare provider: [name], PA-C   CC: DM fu  Subjective     HPI     Follow-up    Additional comments: Patient states that she has no questions or concerns today. States that she is pleased with the results Ozempic has given her.      Last edited by Sarina Ill, CMA on 10/12/2022  8:12 AM.      Diabetes Mellitus Type II, Follow-up  Lab Results  Component Value Date   HGBA1C 7.1 (A) 03/21/2022   HGBA1C 6.5 (A) 09/20/2021   HGBA1C 6.1 (A) 08/25/2020   Wt Readings from Last 3 Encounters:  10/12/22 165 lb 14.4 oz (75.3 kg)  07/20/22 186 lb (84.4 kg)  06/02/22 187 lb (84.8 kg)   Last seen for diabetes 3 months ago.  Management since then includes Metformin 500mg  and Ozempic 1mg . She reports good compliance with treatment. She is not having side effects. She had to decrease her dose of Metformin as her BS at home dropped to 80 and she did not feel well Symptoms: No fatigue No foot ulcerations  No appetite changes No nausea  No paresthesia of the feet  No polydipsia  No polyuria No visual disturbances   No vomiting    - Home blood sugar records:  100-110  Episodes of hypoglycemia? No   Current insulin regiment: none Most Recent Eye Exam: Last year in September   Pertinent Labs: Lab Results  Component Value Date   CHOL 174 04/28/2022   HDL 29 (L) 04/28/2022   LDLCALC 74 04/28/2022   TRIG 448 (H) 04/28/2022   CHOLHDL 6.0 (H) 04/28/2022   Lab Results  Component Value Date   NA 133 (L) 05/01/2022   K 3.6 05/01/2022   CREATININE 0.47 05/01/2022   GFRNONAA >60 05/01/2022   LABMICR <3.0 01/16/2022   MICRALBCREAT <9 01/16/2022     ---------------------------------------------------------------------------------------------------   Medications: Outpatient Medications Prior to Visit  Medication Sig    ALPRAZolam (XANAX) 0.5 MG tablet Take 1 tablet (0.5 mg total) by mouth every 8 (eight) hours as needed.   amLODipine (NORVASC) 10 MG tablet Take 1 tablet (10 mg total) by mouth daily. (Patient taking differently: Take 7.5 mg by mouth daily.)   aspirin 81 MG tablet Take 81 mg by mouth daily.   escitalopram (LEXAPRO) 10 MG tablet TAKE 1 TABLET BY MOUTH AT BEDTIME   hydrochlorothiazide (HYDRODIURIL) 25 MG tablet TAKE 1 TABLET BY MOUTH DAILY   metFORMIN (GLUCOPHAGE) 1000 MG tablet TAKE ONE TABLET TWICE A DAY WITH MEALS   Metoprolol Succinate 100 MG CS24 Take by mouth in the morning and at bedtime.   PROAIR HFA 108 (90 Base) MCG/ACT inhaler    Semaglutide, 1 MG/DOSE, (OZEMPIC, 1 MG/DOSE,) 4 MG/3ML SOPN INJECT 1MG  AS DIRECTED ONCE A WEEK   simvastatin (ZOCOR) 20 MG tablet TAKE ONE TABLET EACH NIGHT   valsartan (DIOVAN) 160 MG tablet TAKE 1 TABLET BY MOUTH DAILY/THIS REPLACES LOSARTAN   nitroGLYCERIN (NITROSTAT) 0.4 MG SL tablet Place under the tongue.   [DISCONTINUED] fluconazole (DIFLUCAN) 150 MG tablet Please, take 1 tab every 72 hours.   [DISCONTINUED] gabapentin (NEURONTIN) 300 MG capsule Take 600 mg by mouth 3 (three) times daily.   [DISCONTINUED] HYDROcodone-acetaminophen (NORCO/VICODIN) 5-325 MG tablet Take 1 tablet by mouth 2 (two) times daily as needed. (Patient  not taking: Reported on 07/20/2022)   No facility-administered medications prior to visit.    Review of Systems  All other systems reviewed and are negative. Except see HPI     Objective    BP 133/88 (BP Location: Right Arm, Patient Position: Sitting, Cuff Size: Normal)   Pulse 77   Ht 5\' 2"  (1.575 m)   Wt 165 lb 14.4 oz (75.3 kg)   SpO2 100%   BMI 30.34 kg/m    Physical Exam Vitals reviewed.  Constitutional:      General: She is not in acute distress.    Appearance: Normal appearance. She is well-developed. She is obese. She is not diaphoretic.  HENT:     Head: Normocephalic and atraumatic.  Eyes:     General:  No scleral icterus.    Conjunctiva/sclera: Conjunctivae normal.  Neck:     Thyroid: No thyromegaly.  Cardiovascular:     Rate and Rhythm: Normal rate and regular rhythm.     Pulses: Normal pulses.     Heart sounds: Normal heart sounds. No murmur heard. Pulmonary:     Effort: Pulmonary effort is normal. No respiratory distress.     Breath sounds: Normal breath sounds. No wheezing, rhonchi or rales.  Musculoskeletal:        General: Normal range of motion.     Cervical back: Normal range of motion and neck supple.     Right lower leg: No edema.     Left lower leg: No edema.  Lymphadenopathy:     Cervical: No cervical adenopathy.  Skin:    General: Skin is warm and dry.     Findings: No rash.  Neurological:     Mental Status: She is alert and oriented to person, place, and time. Mental status is at baseline.     Motor: No weakness.     Coordination: Coordination normal.     Gait: Gait normal.     Deep Tendon Reflexes: Reflexes normal.  Psychiatric:        Mood and Affect: Mood normal.        Behavior: Behavior normal.        Thought Content: Thought content normal.        Judgment: Judgment normal.       No results found for any visits on 10/12/22.  Assessment & Plan     1. Diabetes mellitus with no complication (HCC) Chronic and stable Continue Metformin 500mg  and Ozempic 1 mg - Hemoglobin A1c - Lipid panel - Comprehensive metabolic panel Will reassess after the lab results  2. Elevated liver enzymes On CMP from 05/01/22 Ast 155 alt 220 - Hemoglobin A1c - Lipid panel - Comprehensive metabolic panel Will recheck hepatic function panel before further investigation. Loss of 20 pounds since the last appt is reassuring as weight loss is a treatment goal for management of ELE.  3. Essential (primary) hypertension Chronic and stable BP today was WNL Continue valsartan 160mg  daily, HCTZ 25 mg daily, amlodipine 10mg , metoprolol succinate ER 100mg . Needs to confirm if  she has been taking all four antihypertensive drugs - Hemoglobin A1c - Lipid panel - Comprehensive metabolic panel Will Fu  4. Hypertriglyceridemia Chronic. Last TGL was 448, HDL 29, VLDL cholesterol 71 04/2022 Continue simvastatin 20mg  daily - Hemoglobin A1c - Lipid panel - Comprehensive metabolic panel Advised healthy diet and daily exercise Will fu in 3 mo  Colonoscopy: cologuard was ordered at her last visit, pending Ophthalmonology exam was done in 9.2023. Waiting for verification/receipt/note  US thyroid, pt declined at this moment Lung Cancer screening due to financial burden, waiting for pt ' approval. Former smoker, 1ppd, 24  pack-years, quit 03/03/11 The USPSTF recommends annual screening for lung cancer with low-dose computed tomography (LDCT) in adults aged 55 to 59 years who have a 20 pack-year smoking history and currently smoke or have quit within the past 15 years. Screening should be discontinued once a person has not smoked for 15 years or develops a health problem that substantially limits life expectancy or the ability or willingness to have curative lung surgery.  Will reassess at the next visit  The patient was advised to call back or seek an in-person evaluation if the symptoms worsen or if the condition fails to improve as anticipated.  I discussed the assessment and treatment plan with the patient. The patient was provided an opportunity to ask questions and all were answered. The patient agreed with the plan and demonstrated an understanding of the instructions.  The entirety of the information documented in the History of Present Illness, Review of Systems and Physical Exam were personally obtained by me. Portions of this information were initially documented by the CMA and reviewed by me for thoroughness and accuracy.   [name], [facility], [place] [phone] (phone) [phone] (fax)

## 2022-10-13 LAB — COMPREHENSIVE METABOLIC PANEL
ALT: 20 IU/L (ref 0–32)
AST: 20 IU/L (ref 0–40)
Albumin/Globulin Ratio: 1.7 (ref 1.2–2.2)
Albumin: 4.5 g/dL (ref 3.8–4.9)
Alkaline Phosphatase: 64 IU/L (ref 44–121)
BUN/Creatinine Ratio: 12 (ref 9–23)
BUN: 7 mg/dL (ref 6–24)
Bilirubin Total: 0.4 mg/dL (ref 0.0–1.2)
CO2: 25 mmol/L (ref 20–29)
Calcium: 10.2 mg/dL (ref 8.7–10.2)
Chloride: 98 mmol/L (ref 96–106)
Creatinine, Ser: 0.6 mg/dL (ref 0.57–1.00)
Globulin, Total: 2.7 g/dL (ref 1.5–4.5)
Glucose: 97 mg/dL (ref 70–99)
Potassium: 4.3 mmol/L (ref 3.5–5.2)
Sodium: 139 mmol/L (ref 134–144)
Total Protein: 7.2 g/dL (ref 6.0–8.5)
eGFR: 108 mL/min/{1.73_m2} (ref >59)

## 2022-10-13 LAB — LIPID PANEL
Chol/HDL Ratio: 3.9 ratio (ref 0.0–4.4)
Cholesterol, Total: 154 mg/dL (ref 100–199)
HDL: 40 mg/dL (ref >39)
LDL Chol Calc (NIH): 92 mg/dL (ref 0–99)
Triglycerides: 125 mg/dL (ref 0–149)
VLDL Cholesterol Cal: 22 mg/dL (ref 5–40)

## 2022-10-13 LAB — HEMOGLOBIN A1C
Est. average glucose Bld gHb Est-mCnc: 117 mg/dL
Hgb A1c MFr Bld: 5.7 % — ABNORMAL HIGH (ref 4.8–5.6)

## 2022-10-13 LAB — SPECIMEN STATUS REPORT

## 2022-10-17 NOTE — Progress Notes (Signed)
Please, let pt that with weigh loss, her Lipids Wnl , her liver enzymes WNL and her A1C 5.7. We could try to increase ozempic dose to 2mg , please, remind me a week or two before the next refill and I will send a Rx

## 2022-10-18 ENCOUNTER — Other Ambulatory Visit: Payer: Self-pay

## 2022-10-18 DIAGNOSIS — E119 Type 2 diabetes mellitus without complications: Secondary | ICD-10-CM

## 2022-10-18 MED ORDER — SEMAGLUTIDE (2 MG/DOSE) 8 MG/3ML ~~LOC~~ SOPN: 2.0000 mg | PEN_INJECTOR | SUBCUTANEOUS | 0 refills | Status: DC

## 2022-11-17 ENCOUNTER — Encounter: Payer: Self-pay | Admitting: Physician Assistant

## 2022-11-20 ENCOUNTER — Other Ambulatory Visit: Payer: Self-pay | Admitting: Family Medicine

## 2022-11-20 NOTE — Telephone Encounter (Signed)
Pt is calling to report that she was denied for her ozempic for her diabetes. Please advise why Cb- [id number]

## 2022-11-21 ENCOUNTER — Encounter: Payer: Self-pay | Admitting: Physician Assistant

## 2022-11-21 MED ORDER — SEMAGLUTIDE (2 MG/DOSE) 8 MG/3ML ~~LOC~~ SOPN: 2.0000 mg | PEN_INJECTOR | SUBCUTANEOUS | 0 refills | Status: DC

## 2022-11-21 NOTE — Telephone Encounter (Signed)
Unable to refill per protocol, last refill by provider 11/21/22.  Requested Prescriptions  Pending Prescriptions Disp Refills   OZEMPIC, 2 MG/DOSE, 8 MG/3ML SOPN [Pharmacy Med Name: OZEMPIC (2 MG/DOSE) 8 MG/3ML SUBQ S] 3 mL 0    Sig: INJECT 2MG UNDER THE SKIN ONCE A WEEK ASDIRECTED     Endocrinology:  Diabetes - GLP-1 Receptor Agonists - semaglutide Failed - 11/20/2022 11:47 AM      Failed - HBA1C in normal range and within 180 days    Hemoglobin A1C  Date Value Ref Range Status  11/20/2012 7.6 (H) 4.2 - 6.3 % Final    Comment:    The American Diabetes Association recommends that a primary goal of therapy should be <7% and that physicians should reevaluate the treatment regimen in patients with HbA1c values consistently >8%.    Hgb A1c MFr Bld  Date Value Ref Range Status  10/12/2022 5.7 (H) 4.8 - 5.6 % Final    Comment:             Prediabetes: 5.7 - 6.4          Diabetes: >6.4          Glycemic control for adults with diabetes: <7.0          Passed - Cr in normal range and within 360 days    Creatinine  Date Value Ref Range Status  11/21/2012 0.48 (L) 0.60 - 1.30 mg/dL Final   Creatinine, Ser  Date Value Ref Range Status  10/12/2022 0.60 0.57 - 1.00 mg/dL Final         Passed - Valid encounter within last 6 months    Recent Outpatient Visits           1 month ago Diabetes mellitus with no complication [facility])   [place], [place], PA-C   4 months ago Annual physical exam   [place], [place], PA-C   5 months ago Diabetes mellitus with no complication [facility])   [place], [place], PA-C   6 months ago Essential (primary) hypertension   [place], [place], PA-C   6 months ago Essential (primary) hypertension   [place] [facility], [place], [place]

## 2022-11-22 NOTE — Telephone Encounter (Signed)
Received a fax from covermymeds for Ozempic 53m/3ml  Key: [id number]

## 2022-11-22 NOTE — Telephone Encounter (Signed)
Janna sent this prescription to pharmacy yesterday

## 2022-11-30 NOTE — Telephone Encounter (Signed)
Contacted [place] at [phone] Submitted on 11/22/22 through [place] # [id number] Clinical questions answered today via phone Advised response is due by March 6

## 2022-11-30 NOTE — Telephone Encounter (Signed)
Information regarding your request Duplicate Pending PA exists. A pending PA request for the patient/medication combination exists  Message received when trying to submit PA with provided key. Has someone else already submitted? If so, can status please be checked as we have received another PA request via fax

## 2022-12-14 ENCOUNTER — Other Ambulatory Visit: Payer: Self-pay | Admitting: Physician Assistant

## 2022-12-14 ENCOUNTER — Other Ambulatory Visit: Payer: Self-pay | Admitting: Family Medicine

## 2022-12-14 DIAGNOSIS — E119 Type 2 diabetes mellitus without complications: Secondary | ICD-10-CM

## 2022-12-14 DIAGNOSIS — F439 Reaction to severe stress, unspecified: Secondary | ICD-10-CM

## 2022-12-14 NOTE — Telephone Encounter (Signed)
Requested medication (s) are due for refill today: yes  Requested medication (s) are on the active medication list: yes  Last refill:  02/08/22 #30/3  Future visit scheduled: no  Notes to clinic:  Unable to refill per protocol, cannot delegate.    Requested Prescriptions  Pending Prescriptions Disp Refills   ALPRAZolam (XANAX) 0.5 MG tablet [Pharmacy Med Name: ALPRAZOLAM 0.5 MG TAB] 30 tablet     Sig: TAKE 1 TABLET BY MOUTH EVERY 8 HOURS AS NEEDED     Not Delegated - Psychiatry: Anxiolytics/Hypnotics 2 Failed - 12/14/2022  3:49 PM      Failed - This refill cannot be delegated      Failed - Urine Drug Screen completed in last 360 days      Passed - Patient is not pregnant      Passed - Valid encounter within last 6 months    Recent Outpatient Visits           2 months ago Diabetes mellitus with no complication [facility])   [place], [place], PA-C   4 months ago Annual physical exam   [place], [place], PA-C   6 months ago Diabetes mellitus with no complication [facility])   [place], [place], PA-C   6 months ago Essential (primary) hypertension   [place], [place], PA-C   7 months ago Essential (primary) hypertension   [place], [place], [place]

## 2022-12-15 MED ORDER — SEMAGLUTIDE (2 MG/DOSE) 8 MG/3ML ~~LOC~~ SOPN: 2.0000 mg | PEN_INJECTOR | SUBCUTANEOUS | 2 refills | Status: DC

## 2022-12-15 NOTE — Telephone Encounter (Signed)
Attempted to contact patient regarding refill  Called x2 VM is full, unable to leave a message

## 2022-12-26 ENCOUNTER — Encounter: Payer: Self-pay | Admitting: Physician Assistant

## 2022-12-26 NOTE — Telephone Encounter (Signed)
This encounter was created in error - please disregard.

## 2023-01-09 IMAGING — CT CT HEAD W/O CM
4 series · 16 of 47 positions shown, 18 images · non-contrast
Comparison: 12/03/2000 head CT, report only.

CLINICAL DATA: Hypertension and headache for 1 week.



[Series 2: head wo · axial · 0.42mm/px · z∈[-114,+6]mm · 7 of 32 slices shown, 9 images]
[im 4/32  brain]
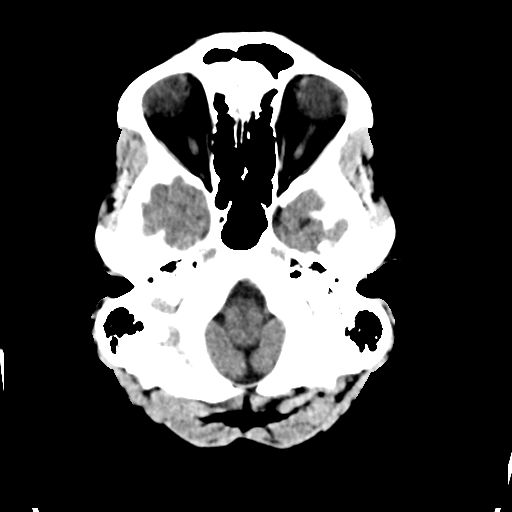
[im 4/32  bone]
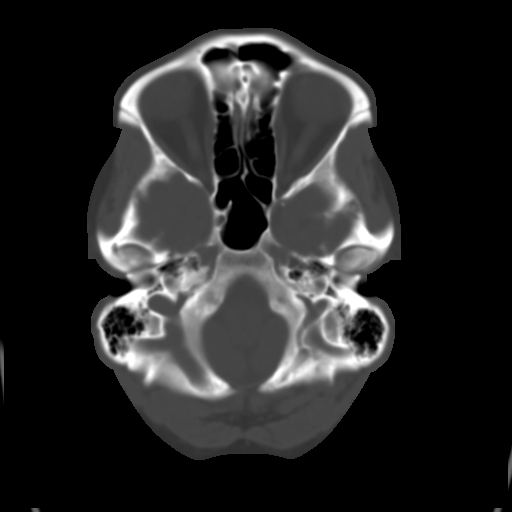
[im 8/32  brain]
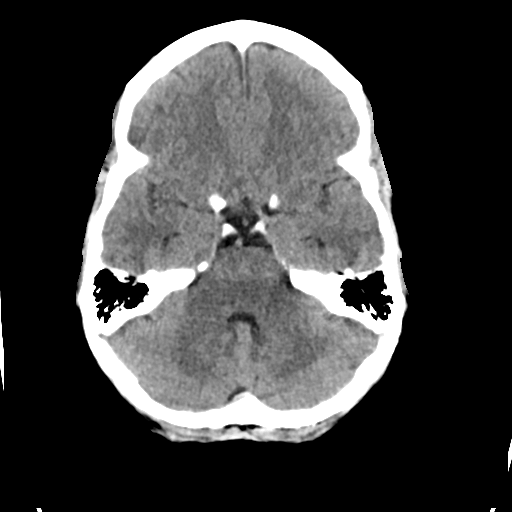
[im 12/32  brain]
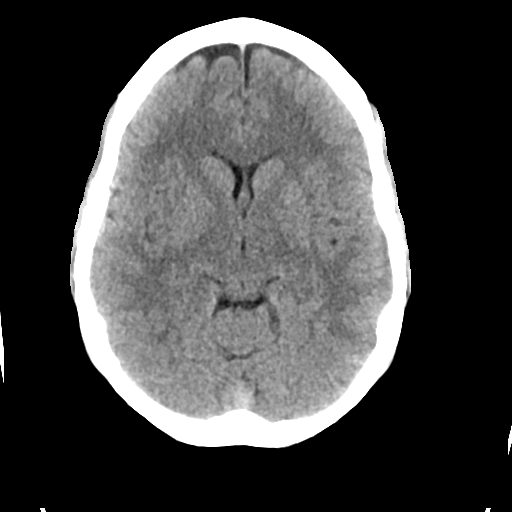
[im 16/32  brain]
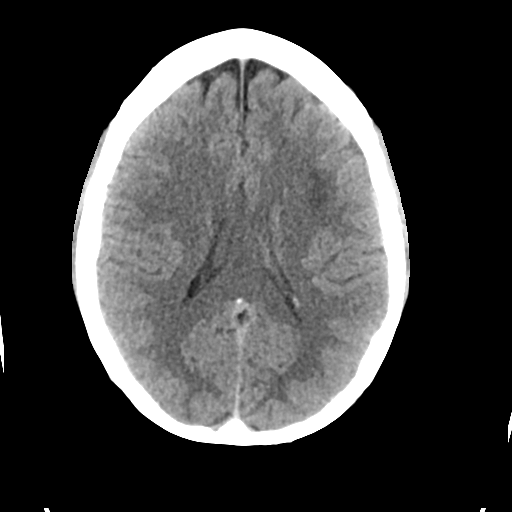
[im 20/32  brain]
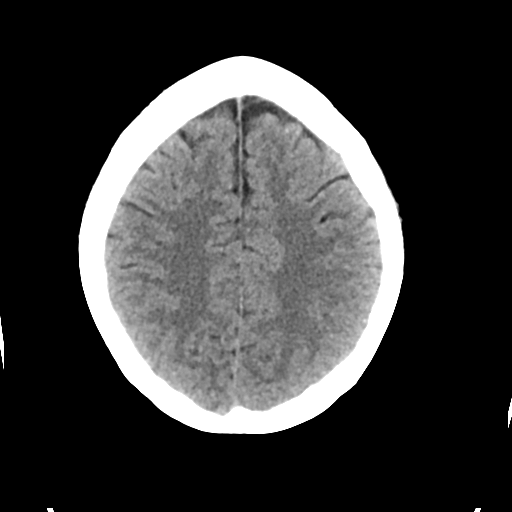
[im 20/32  bone]
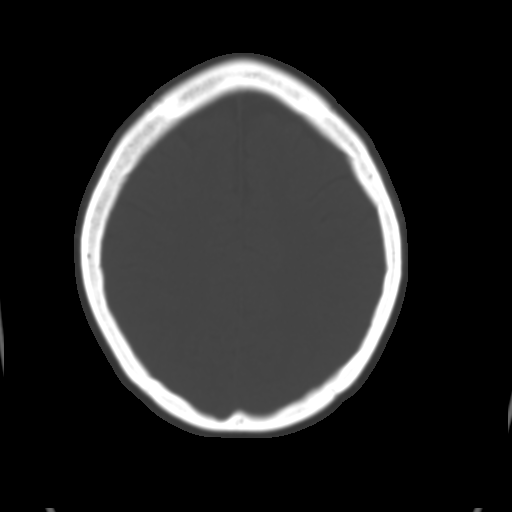
[im 24/32  brain]
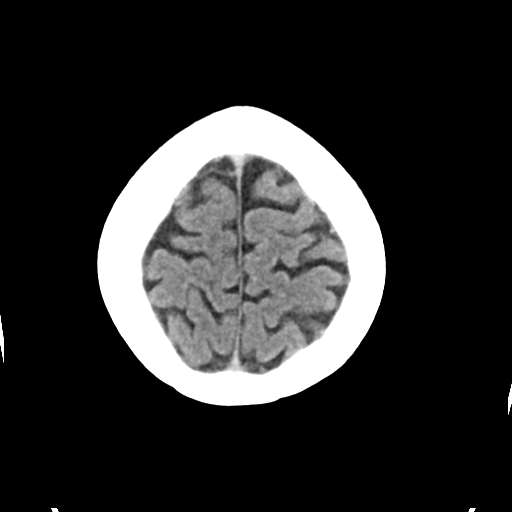
[im 28/32  brain]
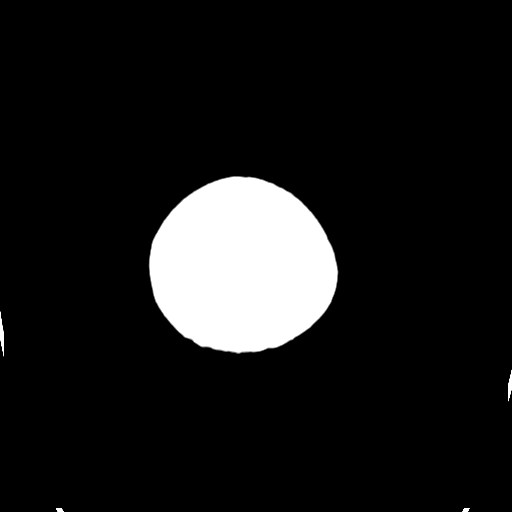

[Series 3: head bone · axial · 0.42mm/px · z∈[-115,-83]mm · 3 of 78 slices shown]
[im 8/78  bone]
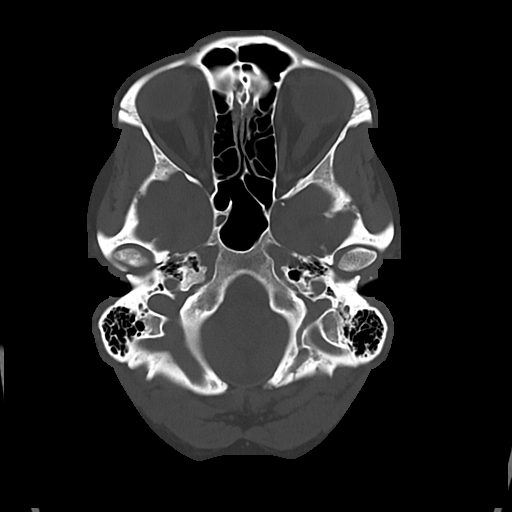
[im 16/78  bone]
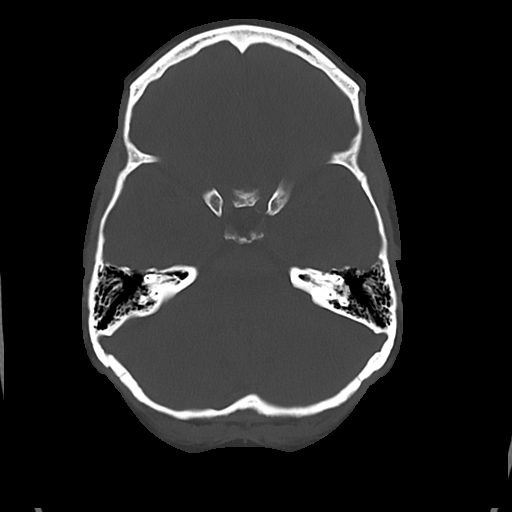
[im 24/78  bone]
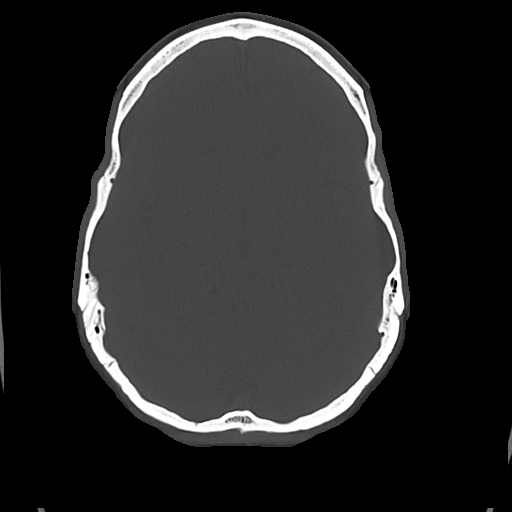

[Series 4: cor soft · coronal · 0.34mm/px · 3 of 64 slices shown]
[im 22/64  brain]
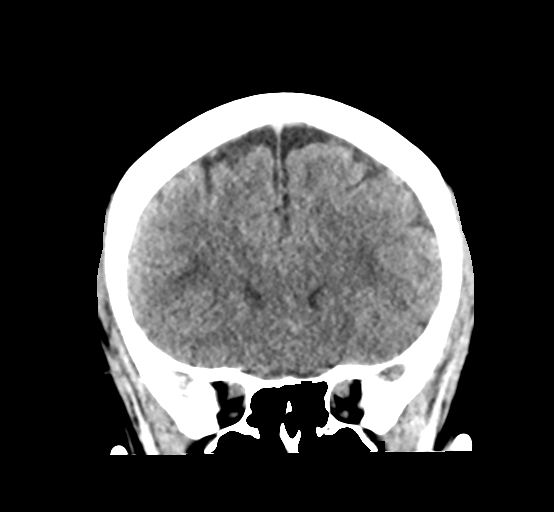
[im 29/64  brain]
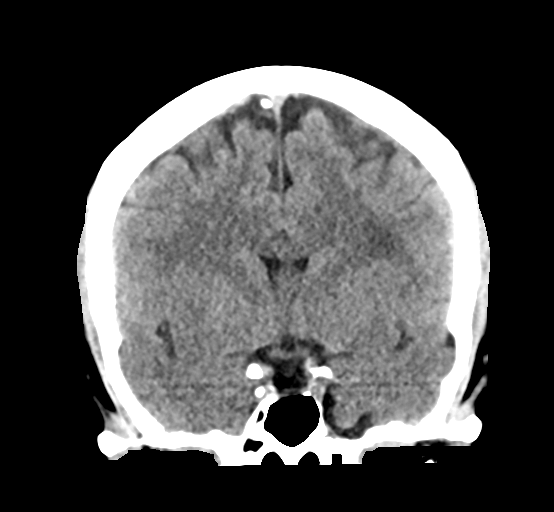
[im 36/64  brain]
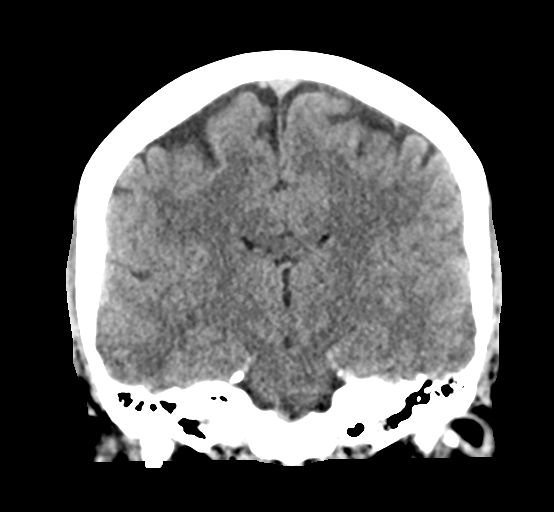

[Series 5: sag soft · sagittal · 0.31mm/px · 3 of 56 slices shown]
[im 19/56  brain]
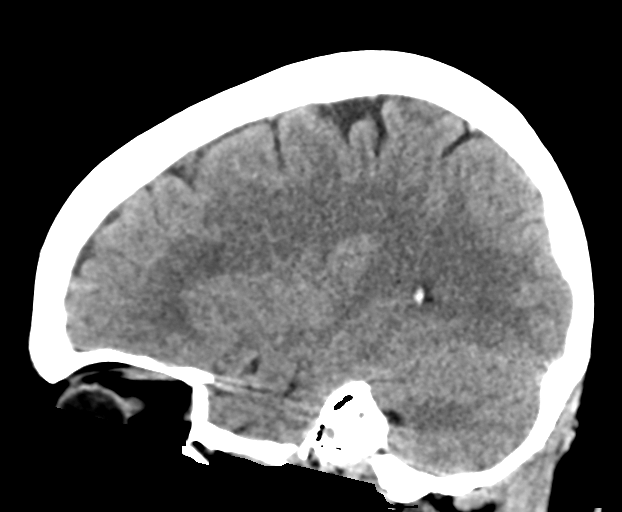
[im 28/56  brain]
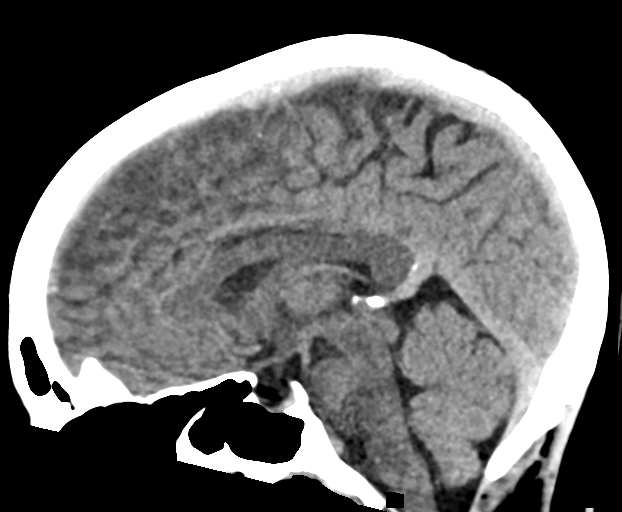
[im 37/56  brain]
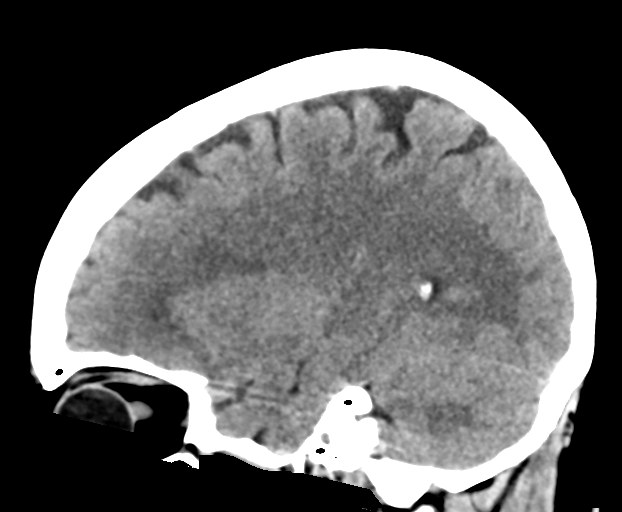

[16 of 47 positions shown; findings below may reference images not displayed]

FINDINGS: Brain: No hemorrhage, hydrocephalus, intra-axial, or extra-axial
fluid collection. Subtle hypoattenuation within the subcortical
white matter of both frontal lobes, including on [DATE] and coronal
image 24. No mass-effect or midline shift.

Vascular: No hyperdense vessel or unexpected calcification.

Skull: Normal

Sinuses/Orbits: Normal imaged portions of the orbits and globes.
Clear paranasal sinuses and mastoid air cells.

Other: None.
IMPRESSION: Subtle hypoattenuation within the subcortical white matter of both
frontal lobes, nonspecific. Differential considerations include most
likely age advanced chronic microvascular ischemic disease (given
history of diabetes), demyelination, or sequelae of migraines.
Consider further evaluation with pre and post-contrast brain MR.

## 2023-01-09 IMAGING — CR DG CHEST 2V
2 series · 2 of 2 positions shown · non-contrast
Comparison: [DATE]

CLINICAL DATA: Shortness of breath. Hypertension and headache for 1
week.

EXAM:
CHEST - 2 VIEW

[chest pa]
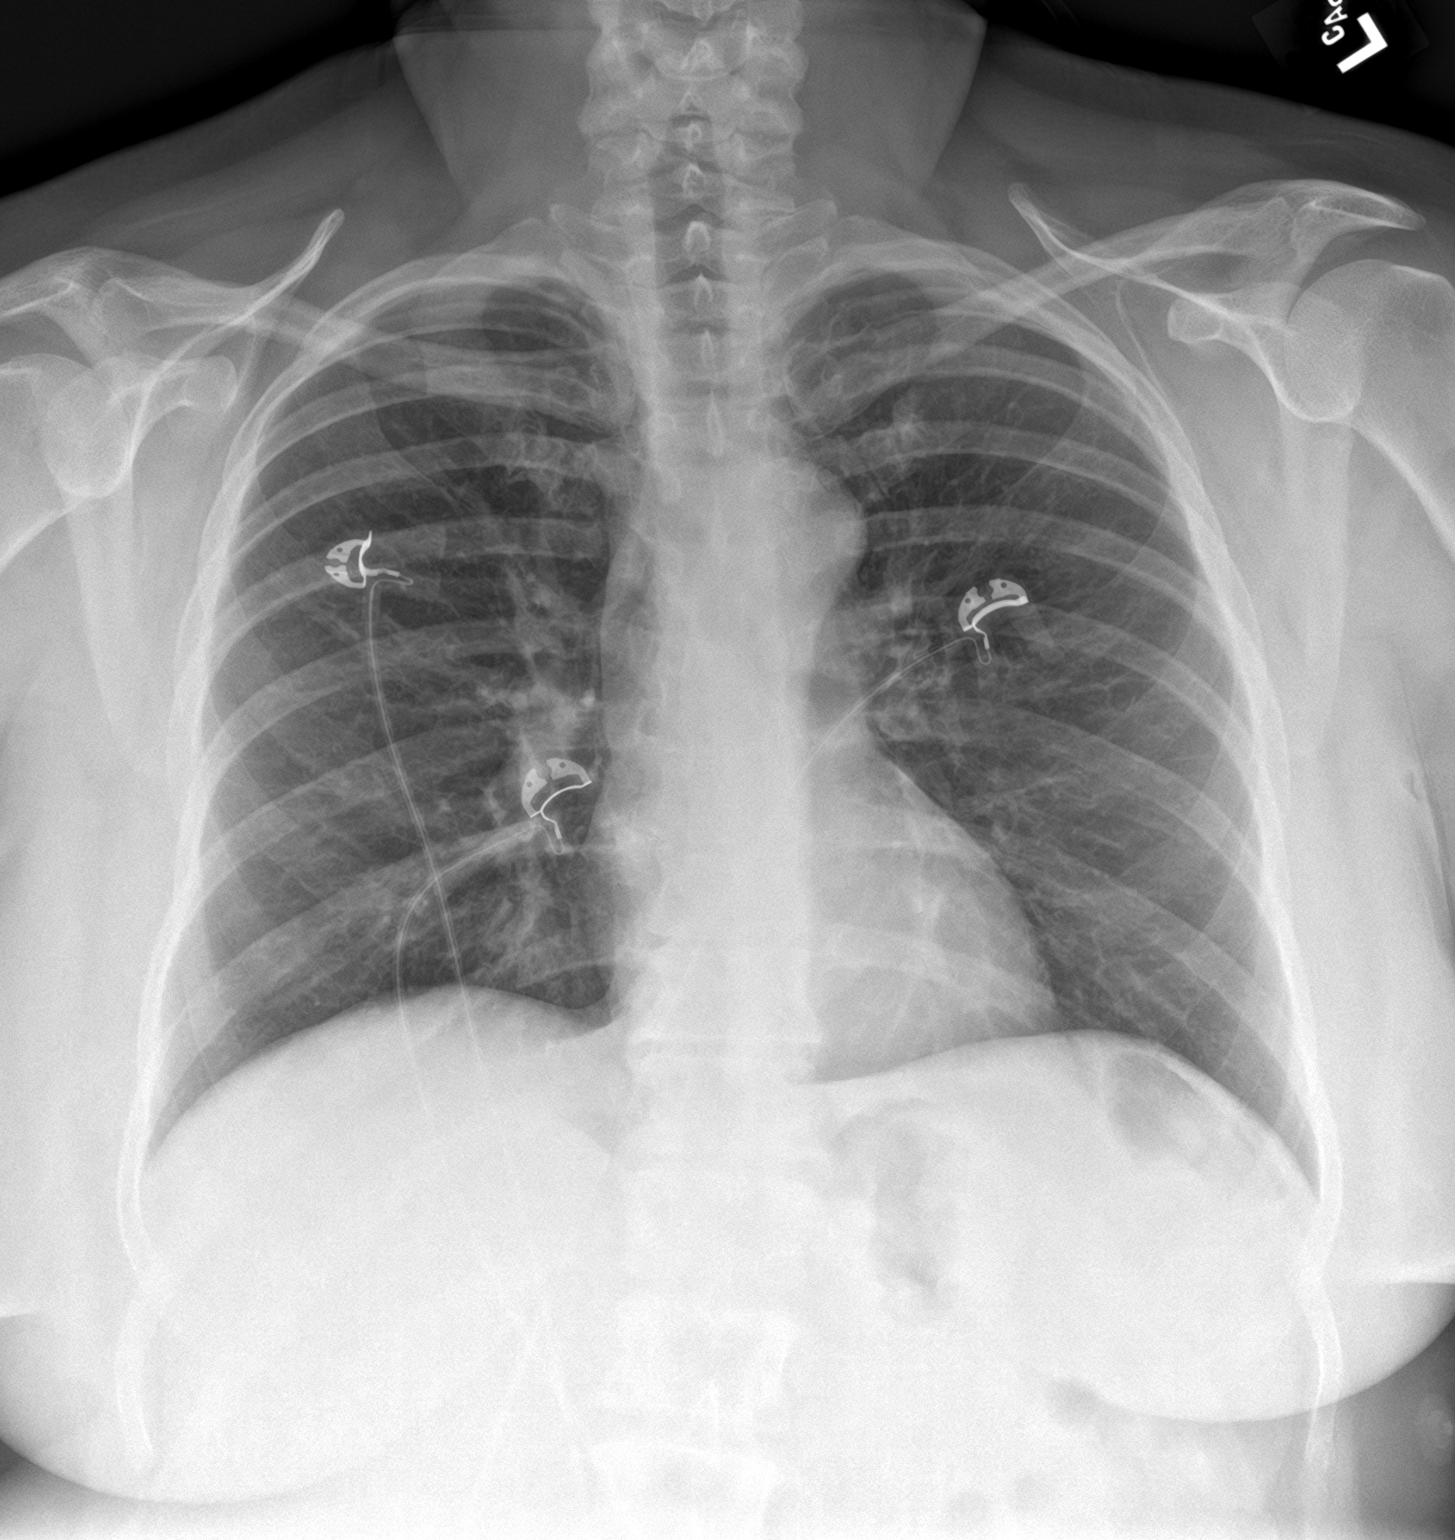

[chest lat]
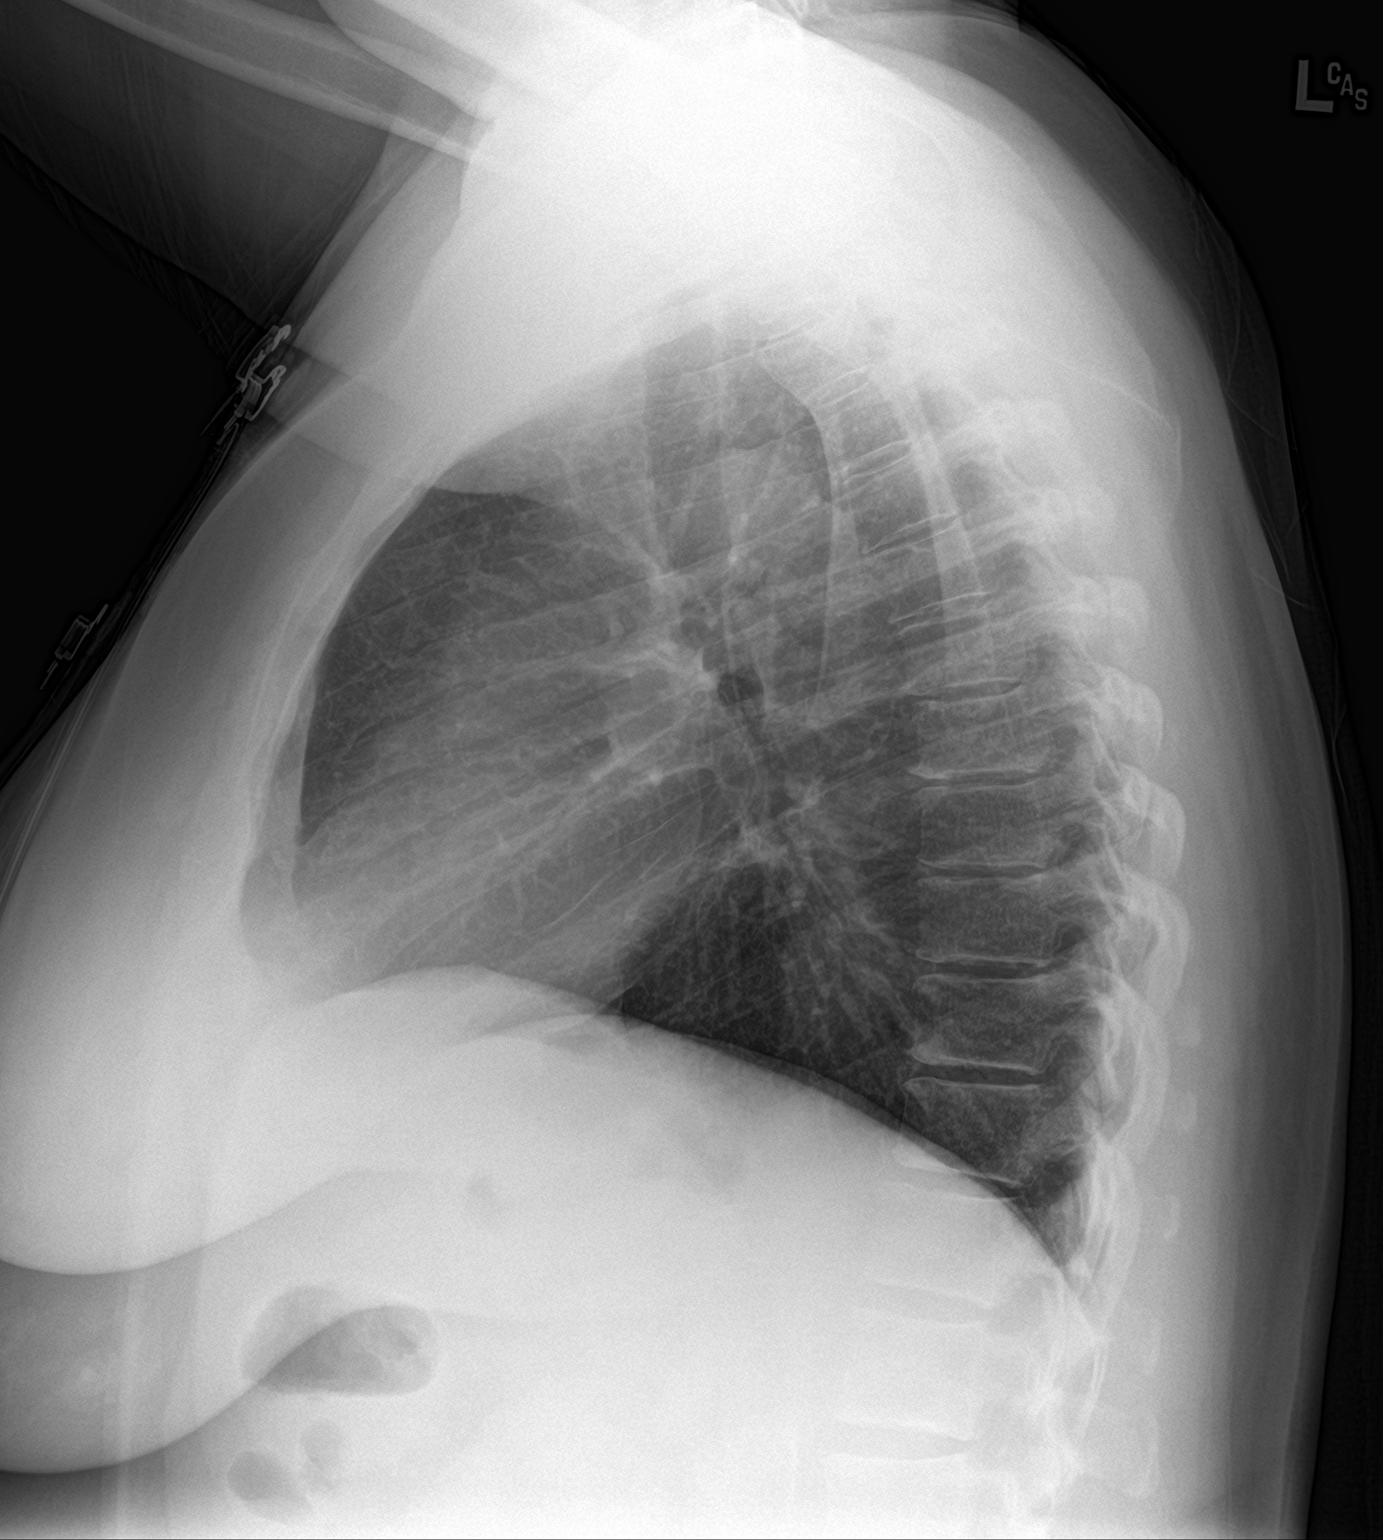

[2 of 2 positions shown; findings below may reference images not displayed]

FINDINGS: Normal heart size and mediastinal contours. There is no pleural
effusion or edema identified. No airspace densities. The visualized
osseous structures are unremarkable.
IMPRESSION: No active cardiopulmonary abnormalities.

## 2023-01-19 ENCOUNTER — Ambulatory Visit: Payer: BC Managed Care – PPO | Admitting: Physician Assistant

## 2023-01-19 NOTE — Progress Notes (Deleted)
      Established patient visit   Patient: Gabrielle Owens   DOB: 1971/07/05   52 y.o. Female  MRN: [id number] Visit Date: 01/19/2023  Today's healthcare provider: [name], PA-C   No chief complaint on file.  Subjective    HPI  ***  Medications: Outpatient Medications Prior to Visit  Medication Sig   ALPRAZolam (XANAX) 0.5 MG tablet TAKE 1 TABLET BY MOUTH EVERY 8 HOURS AS NEEDED   amLODipine (NORVASC) 10 MG tablet Take 1 tablet (10 mg total) by mouth daily. (Patient taking differently: Take 7.5 mg by mouth daily.)   aspirin 81 MG tablet Take 81 mg by mouth daily.   escitalopram (LEXAPRO) 10 MG tablet TAKE 1 TABLET BY MOUTH AT BEDTIME   hydrochlorothiazide (HYDRODIURIL) 25 MG tablet TAKE 1 TABLET BY MOUTH DAILY   metFORMIN (GLUCOPHAGE) 1000 MG tablet TAKE ONE TABLET TWICE A DAY WITH MEALS   Metoprolol Succinate 100 MG CS24 Take by mouth in the morning and at bedtime.   nitroGLYCERIN (NITROSTAT) 0.4 MG SL tablet Place under the tongue.   PROAIR HFA 108 (90 Base) MCG/ACT inhaler    Semaglutide, 2 MG/DOSE, 8 MG/3ML SOPN Inject 2 mg as directed once a week.   simvastatin (ZOCOR) 20 MG tablet TAKE ONE TABLET EACH NIGHT   valsartan (DIOVAN) 160 MG tablet TAKE 1 TABLET BY MOUTH DAILY ***THIS REPLACES LOSARTAN***   No facility-administered medications prior to visit.    Review of Systems  {Labs  Heme  Chem  Endocrine  Serology  Results Review (optional):23779}   Objective    There were no vitals taken for this visit. {Show previous vital signs (optional):23777}  Physical Exam  ***  No results found for any visits on 01/19/23.  Assessment & Plan     ***  No follow-ups on file.      The patient was advised to call back or seek an in-person evaluation if the symptoms worsen or if the condition fails to improve as anticipated.  I discussed the assessment and treatment plan with the patient. The patient was provided an opportunity to ask questions and all were  answered. The patient agreed with the plan and demonstrated an understanding of the instructions.  I, [name], PA-C have reviewed all documentation for this visit. The documentation on 01/19/2023 for the exam, diagnosis, procedures, and orders are all accurate and complete.  [name], [facility], MMS [facility] [phone] (phone) [phone] (fax)   [facility]

## 2023-02-01 ENCOUNTER — Ambulatory Visit: Payer: BC Managed Care – PPO | Admitting: Physician Assistant

## 2023-02-22 DIAGNOSIS — E7800 Pure hypercholesterolemia, unspecified: Secondary | ICD-10-CM | POA: Diagnosis not present

## 2023-02-22 DIAGNOSIS — I2510 Atherosclerotic heart disease of native coronary artery without angina pectoris: Secondary | ICD-10-CM | POA: Diagnosis not present

## 2023-02-22 DIAGNOSIS — R002 Palpitations: Secondary | ICD-10-CM | POA: Diagnosis not present

## 2023-02-22 DIAGNOSIS — I10 Essential (primary) hypertension: Secondary | ICD-10-CM | POA: Diagnosis not present

## 2023-03-08 ENCOUNTER — Encounter: Payer: Self-pay | Admitting: Physician Assistant

## 2023-03-08 DIAGNOSIS — E119 Type 2 diabetes mellitus without complications: Secondary | ICD-10-CM

## 2023-03-14 ENCOUNTER — Ambulatory Visit (INDEPENDENT_AMBULATORY_CARE_PROVIDER_SITE_OTHER): Payer: PRIVATE HEALTH INSURANCE | Admitting: Physician Assistant

## 2023-03-14 VITALS — BP 134/88 | HR 69

## 2023-03-14 DIAGNOSIS — E049 Nontoxic goiter, unspecified: Secondary | ICD-10-CM

## 2023-03-14 DIAGNOSIS — E119 Type 2 diabetes mellitus without complications: Secondary | ICD-10-CM

## 2023-03-14 LAB — POCT GLYCOSYLATED HEMOGLOBIN (HGB A1C): Hemoglobin A1C: 5.4 % (ref 4.0–5.6)

## 2023-03-14 NOTE — Progress Notes (Signed)
I,[name],acting as a Neurosurgeon for [employer], PA-C.,have documented all relevant documentation on the behalf of Gabrielle Lat, PA-C,as directed by  [employer], PA-C while in the presence of [employer], PA-C.   Established patient visit   Patient: Gabrielle Owens   DOB: August 14, 1971   52 y.o. Female  MRN: [id number] Visit Date: 03/14/2023  Today's healthcare provider: [name], PA-C   Chief Complaint  Patient presents with   Medical Management of Chronic Issues   Subjective    Discussed the use of AI scribe software for clinical note transcription with the patient, who gave verbal consent to proceed.  History of Present Illness        The patient, with a history of diabetes since 2010, presents for a follow-up visit after significant weight loss and health improvement. The patient reports losing almost 30 pounds and going from a size 14 to a size 4. The patient attributes this weight loss to lifestyle changes including diet and exercise, as well as the use of Ozempic for diabetes. The patient reports feeling very good and full of energy.  The patient has made significant dietary changes, including cutting out red meat and dairy, and increasing intake of fruits, vegetables, and protein drinks. The patient also reports regular exercise, including walking and bike riding. The patient notes that these changes have not only led to weight loss but also improved blood pressure and lipid levels. The patient reports no issues with bowel movements or urination, and no numbness or tingling sensations.  The patient also reports that they have stopped taking half of their blood pressure medication due to improvements in blood pressure. The patient's blood sugar levels have also improved, usually ranging between 88 and 100. The patient reports occasional nausea, which they attribute to the Ozempic.  Diabetes Mellitus Type II, Follow-up  Lab Results  Component Value Date   HGBA1C  5.4 03/14/2023   HGBA1C 5.7 (H) 10/12/2022   HGBA1C 7.1 (A) 03/21/2022   Wt Readings from Last 3 Encounters:  10/12/22 165 lb 14.4 oz (75.3 kg)  07/20/22 186 lb (84.4 kg)  06/02/22 187 lb (84.8 kg)   Last seen for diabetes 5 months ago.  Management since then includes continue metformin 500 mg and ozempic 1 mg. She reports excellent compliance with treatment. She is not having side effects. Symptoms: No fatigue No foot ulcerations  No appetite changes No nausea  No paresthesia of the feet  No polydipsia  No polyuria No visual disturbances   No vomiting     Home blood sugar records:  on occasions  Episodes of hypoglycemia? No    Current insulin regiment: none Most Recent Eye Exam: ROI sent via fax   Pertinent Labs: Lab Results  Component Value Date   CHOL 154 10/12/2022   HDL 40 10/12/2022   LDLCALC 92 10/12/2022   TRIG 125 10/12/2022   CHOLHDL 3.9 10/12/2022   Lab Results  Component Value Date   NA 139 10/12/2022   K 4.3 10/12/2022   CREATININE 0.60 10/12/2022   EGFR 108 10/12/2022   LABMICR <3.0 01/16/2022   MICRALBCREAT <9 01/16/2022      Medications: Outpatient Medications Prior to Visit  Medication Sig   ALPRAZolam (XANAX) 0.5 MG tablet TAKE 1 TABLET BY MOUTH EVERY 8 HOURS AS NEEDED   aspirin 81 MG tablet Take 81 mg by mouth daily.   hydrochlorothiazide (HYDRODIURIL) 25 MG tablet TAKE 1 TABLET BY MOUTH DAILY   Metoprolol Succinate 100  MG CS24 Take by mouth in the morning and at bedtime.   PROAIR HFA 108 (90 Base) MCG/ACT inhaler    Semaglutide, 2 MG/DOSE, 8 MG/3ML SOPN Inject 2 mg as directed once a week.   simvastatin (ZOCOR) 20 MG tablet TAKE ONE TABLET EACH NIGHT   valsartan (DIOVAN) 160 MG tablet TAKE 1 TABLET BY MOUTH DAILY THIS REPLACES LOSARTAN (Patient taking differently: 80 mg.)   amLODipine (NORVASC) 10 MG tablet Take 1 tablet (10 mg total) by mouth daily. (Patient not taking: Reported on 03/14/2023)   escitalopram (LEXAPRO) 10 MG tablet TAKE 1  TABLET BY MOUTH AT BEDTIME (Patient not taking: Reported on 03/14/2023)   metFORMIN (GLUCOPHAGE) 1000 MG tablet TAKE ONE TABLET TWICE A DAY WITH MEALS   nitroGLYCERIN (NITROSTAT) 0.4 MG SL tablet Place under the tongue.   No facility-administered medications prior to visit.    Review of Systems  All other systems reviewed and are negative. Except see HPI      Objective    BP 134/88 (BP Location: Left Arm, Patient Position: Sitting, Cuff Size: Normal)   Pulse 69   SpO2 99%    Physical Exam Vitals reviewed.  Constitutional:      General: She is not in acute distress.    Appearance: Normal appearance. She is well-developed. She is not diaphoretic.  HENT:     Head: Normocephalic and atraumatic.  Eyes:     General: No scleral icterus.    Conjunctiva/sclera: Conjunctivae normal.  Neck:     Thyroid: No thyromegaly.  Cardiovascular:     Rate and Rhythm: Normal rate and regular rhythm.     Pulses: Normal pulses.     Heart sounds: Normal heart sounds. No murmur heard. Pulmonary:     Effort: Pulmonary effort is normal. No respiratory distress.     Breath sounds: Normal breath sounds. No wheezing, rhonchi or rales.  Musculoskeletal:     Cervical back: Neck supple.     Right lower leg: No edema.     Left lower leg: No edema.  Lymphadenopathy:     Cervical: No cervical adenopathy.  Skin:    General: Skin is warm and dry.     Findings: No rash.  Neurological:     Mental Status: She is alert and oriented to person, place, and time. Mental status is at baseline.  Psychiatric:        Mood and Affect: Mood normal.        Behavior: Behavior normal.     Results for orders placed or performed in visit on 03/14/23  POCT HgB A1C  Result Value Ref Range   Hemoglobin A1C 5.4 4.0 - 5.6 %   HbA1c POC (<> result, manual entry)     HbA1c, POC (prediabetic range)     HbA1c, POC (controlled diabetic range)      Assessment & Plan     Weight Loss: Significant weight loss of  approximately 55 pounds since September. Patient reports increased energy, regular exercise, and dietary changes including increased fruit and vegetable intake and decreased meat consumption. No signs of malnutrition or adverse effects from weight loss. -Encourage continuation of healthy lifestyle changes. -Check labs to ensure safe weight loss.  Diabetes: Improved control with Ozempic, diet, and exercise. Patient reports normal home blood glucose readings. -Continue Ozempic 2mg /weekly. Will be preparing PA documentation for pt's new insurance: [employer] -Check A1C and fasting glucose to assess glycemic control. Advised to continue low carb diet and daily exercise  Hypertension: Well  controlled with reduced medication regimen per cardiologist. No symptoms of hypotension reported. -Continue current antihypertensive regimen: metoprolol 100mg ? Advised low sodium diet and daily exercise  Hyperlipidemia: Previously well controlled. No recent labs. -Check lipid panel. Continue taking simvastatin 20mg  daily Advised low cholesterol diet and daily exercise  Thyroid: Physical exam revealed possible thyroid enlargement. No symptoms of thyroid dysfunction reported. -Check TSH to assess thyroid function. Pt was advised to communicate with her new insurance about [place] thyroid coverage  Constipation: Managed with daily fiber gummies. No other gastrointestinal complaints. -Continue fiber gummies as needed. -Consider adding yogurt, prunes, apricots, or kefir to diet for additional fiber.  General Health Maintenance: -Continue regular exercise and healthy diet. -Return in 6 months for follow-up, or sooner if any changes or concerns arise. -Obtain labs for A1C, fasting glucose, lipid panel, and TSH.  No follow-ups on file.      The patient was advised to call back or seek an in-person evaluation if the symptoms worsen or if the condition fails to improve as anticipated.  I discussed the assessment and  treatment plan with the patient. The patient was provided an opportunity to ask questions and all were answered. The patient agreed with the plan and demonstrated an understanding of the instructions.  I, Gabrielle Lat, PA-C have reviewed all documentation for this visit. The documentation on  03/14/23 for the exam, diagnosis, procedures, and orders are all accurate and complete.  Gabrielle Owens, [facility], MMS [facility] [phone] (phone) [phone] (fax)   [place] [facility]

## 2023-03-15 ENCOUNTER — Encounter: Payer: Self-pay | Admitting: Physician Assistant

## 2023-03-15 DIAGNOSIS — E049 Nontoxic goiter, unspecified: Secondary | ICD-10-CM | POA: Insufficient documentation

## 2023-03-15 LAB — MICROALBUMIN / CREATININE URINE RATIO
Creatinine, Urine: 19.9 mg/dL
Microalb/Creat Ratio: <15 mg/g creat (ref 0–29)
Microalbumin, Urine: <3 ug/mL

## 2023-03-16 ENCOUNTER — Encounter: Payer: Self-pay | Admitting: Family Medicine

## 2023-03-16 ENCOUNTER — Other Ambulatory Visit: Payer: Self-pay | Admitting: Physician Assistant

## 2023-03-16 DIAGNOSIS — E119 Type 2 diabetes mellitus without complications: Secondary | ICD-10-CM

## 2023-03-16 LAB — CBC WITH DIFFERENTIAL/PLATELET
Basophils Absolute: 0 10*3/uL (ref 0.0–0.2)
Basos: 0 %
EOS (ABSOLUTE): 0.1 10*3/uL (ref 0.0–0.4)
Eos: 1 %
Hematocrit: 38.2 % (ref 34.0–46.6)
Hemoglobin: 12.1 g/dL (ref 11.1–15.9)
Immature Grans (Abs): 0 10*3/uL (ref 0.0–0.1)
Immature Granulocytes: 0 %
Lymphocytes Absolute: 2.7 10*3/uL (ref 0.7–3.1)
Lymphs: 32 %
MCH: 28.8 pg (ref 26.6–33.0)
MCHC: 31.7 g/dL (ref 31.5–35.7)
MCV: 91 fL (ref 79–97)
Monocytes Absolute: 0.6 10*3/uL (ref 0.1–0.9)
Monocytes: 7 %
Neutrophils Absolute: 4.8 10*3/uL (ref 1.4–7.0)
Neutrophils: 60 %
Platelets: 318 10*3/uL (ref 150–450)
RBC: 4.2 x10E6/uL (ref 3.77–5.28)
RDW: 13.2 % (ref 11.7–15.4)
WBC: 8.3 10*3/uL (ref 3.4–10.8)

## 2023-03-16 LAB — TSH+FREE T4
Free T4: 1.17 ng/dL (ref 0.82–1.77)
TSH: 1.24 u[IU]/mL (ref 0.450–4.500)

## 2023-03-16 LAB — COMPREHENSIVE METABOLIC PANEL
ALT: 13 IU/L (ref 0–32)
AST: 14 IU/L (ref 0–40)
Albumin/Globulin Ratio: 1.7
Albumin: 4.2 g/dL (ref 3.8–4.9)
Alkaline Phosphatase: 72 IU/L (ref 44–121)
BUN/Creatinine Ratio: 13 (ref 9–23)
BUN: 8 mg/dL (ref 6–24)
Bilirubin Total: 0.3 mg/dL (ref 0.0–1.2)
CO2: 26 mmol/L (ref 20–29)
Calcium: 9.8 mg/dL (ref 8.7–10.2)
Chloride: 99 mmol/L (ref 96–106)
Creatinine, Ser: 0.62 mg/dL (ref 0.57–1.00)
Globulin, Total: 2.5 g/dL (ref 1.5–4.5)
Glucose: 94 mg/dL (ref 70–99)
Potassium: 4.3 mmol/L (ref 3.5–5.2)
Sodium: 137 mmol/L (ref 134–144)
Total Protein: 6.7 g/dL (ref 6.0–8.5)
eGFR: 107 mL/min/{1.73_m2} (ref >59)

## 2023-03-16 LAB — LIPID PANEL
Chol/HDL Ratio: 3.3 ratio (ref 0.0–4.4)
Cholesterol, Total: 150 mg/dL (ref 100–199)
HDL: 45 mg/dL (ref >39)
LDL Chol Calc (NIH): 85 mg/dL (ref 0–99)
Triglycerides: 110 mg/dL (ref 0–149)
VLDL Cholesterol Cal: 20 mg/dL (ref 5–40)

## 2023-03-16 NOTE — Progress Notes (Signed)
Please, let pt know that her lab results were WNL including TSH.

## 2023-03-16 NOTE — Telephone Encounter (Signed)
Requested Prescriptions  Pending Prescriptions Disp Refills   OZEMPIC, 2 MG/DOSE, 8 MG/3ML SOPN [Pharmacy Med Name: OZEMPIC (2 MG/DOSE) 8 MG/3ML SUBQ S] 3 mL 0    Sig: INJECT 2MG  AS DIRECTED ONCE A WEEK     Endocrinology:  Diabetes - GLP-1 Receptor Agonists - semaglutide Passed - 03/16/2023 12:25 PM      Passed - HBA1C in normal range and within 180 days    Hemoglobin A1C  Date Value Ref Range Status  03/14/2023 5.4 4.0 - 5.6 % Final  11/20/2012 7.6 (H) 4.2 - 6.3 % Final    Comment:    The American Diabetes Association recommends that a primary goal of therapy should be <7% and that physicians should reevaluate the treatment regimen in patients with HbA1c values consistently >8%.    Hgb A1c MFr Bld  Date Value Ref Range Status  10/12/2022 5.7 (H) 4.8 - 5.6 % Final    Comment:             Prediabetes: 5.7 - 6.4          Diabetes: >6.4          Glycemic control for adults with diabetes: <7.0          Passed - Cr in normal range and within 360 days    Creatinine  Date Value Ref Range Status  11/21/2012 0.48 (L) 0.60 - 1.30 mg/dL Final   Creatinine, Ser  Date Value Ref Range Status  03/15/2023 0.62 0.57 - 1.00 mg/dL Final         Passed - Valid encounter within last 6 months    Recent Outpatient Visits           2 days ago Diabetes mellitus with no complication [facility])   [place] [facility] [place], [place], PA-C   5 months ago Diabetes mellitus with no complication [facility])   [place] [facility] [place], [place], PA-C   7 months ago Annual physical exam   [place] [facility] [place], [place], PA-C   9 months ago Diabetes mellitus with no complication [facility])   [place] [facility] [place], [place], PA-C   10 months ago Essential (primary) hypertension   [place] [facility] [place], [place], [place]

## 2023-03-19 ENCOUNTER — Telehealth: Payer: Self-pay | Admitting: Family Medicine

## 2023-03-19 NOTE — Telephone Encounter (Signed)
[facility] requesting prior authorization Key: [id number] Name: Gabrielle Owens 2 MG/dose 8MG /87ml pen injectors

## 2023-03-20 NOTE — Telephone Encounter (Signed)
PA started

## 2023-03-22 ENCOUNTER — Encounter: Payer: Self-pay | Admitting: Physician Assistant

## 2023-03-23 ENCOUNTER — Encounter: Payer: Self-pay | Admitting: Physician Assistant

## 2023-04-03 ENCOUNTER — Other Ambulatory Visit: Payer: Self-pay | Admitting: Family Medicine

## 2023-04-12 ENCOUNTER — Telehealth: Payer: Self-pay | Admitting: Family Medicine

## 2023-04-12 MED ORDER — RYBELSUS 7 MG PO TABS
7.0000 mg | ORAL_TABLET | Freq: Every day | ORAL | 1 refills | Status: DC
Start: 2023-04-12 — End: 2023-05-02

## 2023-04-12 NOTE — Addendum Note (Signed)
Addended by: [name] on: 04/12/2023 05:45 AM   Modules accepted: Orders

## 2023-04-12 NOTE — Telephone Encounter (Signed)
Received a fax from covermymeds for Rybelsus 7mg   Key: [id number]

## 2023-04-16 NOTE — Telephone Encounter (Signed)
PA started today.  

## 2023-04-25 NOTE — Telephone Encounter (Signed)
PA approval in chart.

## 2023-04-25 NOTE — Telephone Encounter (Signed)
Patient co-pay for #30 is $ 150. Please advise.

## 2023-05-02 ENCOUNTER — Telehealth: Payer: Self-pay | Admitting: Physician Assistant

## 2023-05-02 ENCOUNTER — Other Ambulatory Visit: Payer: BC Managed Care – PPO | Admitting: Pharmacist

## 2023-05-02 DIAGNOSIS — E119 Type 2 diabetes mellitus without complications: Secondary | ICD-10-CM

## 2023-05-02 NOTE — Progress Notes (Signed)
   05/02/2023  Patient ID: Lauraine Rinne, female   DOB: 12-20-1970, 52 y.o.   MRN: [id number]  Receive referral message from PA [facility] requesting outreach to patient for help with medication assistance with the cost of Rybelsus or Ozempic. Reports office recently completed PA for Rybelsus, but patient's copayment is $150 for 1 month supply.  From review of chart, note patient has Nurse, learning disability and therefore is eligible to use manufacturer copay savings card  Outreach to patient by telephone today. Patient shares that she no longer needs assistance with cost of Rybelsus as she was already able to pick up Ozempic for $25/month copay using manufacturer copay savings card  Plan:  No further action needed. Medication access barrier resolved. Provide patient with contact information for clinic pharmacist to contact if needed in future for medication questions/concerns  Estelle Grumbles, PharmD, [facility] [phone]

## 2023-05-09 ENCOUNTER — Other Ambulatory Visit: Payer: Self-pay | Admitting: Physician Assistant

## 2023-05-09 ENCOUNTER — Telehealth: Payer: Self-pay | Admitting: Family Medicine

## 2023-05-09 DIAGNOSIS — E119 Type 2 diabetes mellitus without complications: Secondary | ICD-10-CM

## 2023-05-09 MED ORDER — OZEMPIC (2 MG/DOSE) 8 MG/3ML ~~LOC~~ SOPN
2.0000 mg | PEN_INJECTOR | SUBCUTANEOUS | 3 refills | Status: DC
Start: 2023-05-09 — End: 2023-11-16

## 2023-05-09 NOTE — Telephone Encounter (Signed)
[facility] is requesting prior authorization Key: [id number] Name: Kreis Ozempic 2mg /dose 8mg /53ml pen injectors

## 2023-05-10 ENCOUNTER — Other Ambulatory Visit: Payer: Self-pay | Admitting: Family Medicine

## 2023-05-11 NOTE — Telephone Encounter (Signed)
Requested Prescriptions  Pending Prescriptions Disp Refills   simvastatin (ZOCOR) 20 MG tablet [Pharmacy Med Name: SIMVASTATIN 20 MG TAB] 90 tablet 1    Sig: TAKE ONE TABLET EACH NIGHT     Cardiovascular:  Antilipid - Statins Failed - 05/10/2023 11:25 AM      Failed - Lipid Panel in normal range within the last 12 months    Cholesterol, Total  Date Value Ref Range Status  03/15/2023 150 100 - 199 mg/dL Final   Cholesterol  Date Value Ref Range Status  11/20/2012 377 (H) 0 - 200 mg/dL Final   Ldl Cholesterol, Calc  Date Value Ref Range Status  11/20/2012 SEE COMMENT 0 - 100 mg/dL Final    Comment:    VLDL/LDL - Unable to report VLDL and LDL due to a  - Triglyceride value that is 400 mg/dL or   - greater. CHOLESTEROL - REPEATED AND CONFIRMED BY DILUTION    LDL Chol Calc (NIH)  Date Value Ref Range Status  03/15/2023 85 0 - 99 mg/dL Final   HDL Cholesterol  Date Value Ref Range Status  11/20/2012 23 (L) 40 - 60 mg/dL Final   HDL  Date Value Ref Range Status  03/15/2023 45 >39 mg/dL Final   Triglycerides  Date Value Ref Range Status  03/15/2023 110 0 - 149 mg/dL Final  16/07/9603 540 (H) 0 - 200 mg/dL Final         Passed - Patient is not pregnant      Passed - Valid encounter within last 12 months    Recent Outpatient Visits           1 month ago Diabetes mellitus with no complication (HCC)   Gabrielle Owens [place], [place], PA-C   7 months ago Diabetes mellitus with no complication [name]   [place] [facility] [place], [place], PA-C   9 months ago Annual physical exam   [name], [place], PA-C   11 months ago Diabetes mellitus with no complication [name]   [place] [facility] [place], [place], PA-C   11 months ago Essential (primary) hypertension   [name], [place], [place]

## 2023-05-12 NOTE — Telephone Encounter (Signed)
Message was sent

## 2023-05-14 NOTE — Telephone Encounter (Signed)
PA sent to plan per cover my meds

## 2023-05-16 NOTE — Telephone Encounter (Signed)
Regarding the PA for Rybelsus 7 mg. We received a letter from the plan saying that "based on the patient's plan, any medication over the cost of $350 for a 1-35 day supply,$700 for a 36-60 day supply, or $1050 for a 60-90 day is restricted.  The patient will be contacted regarding next steps. You will be contacted if additional steps are needed from your office for this request" Letter under media tab

## 2023-07-05 ENCOUNTER — Other Ambulatory Visit: Payer: Self-pay | Admitting: Family Medicine

## 2023-07-05 DIAGNOSIS — F439 Reaction to severe stress, unspecified: Secondary | ICD-10-CM

## 2023-08-07 ENCOUNTER — Other Ambulatory Visit: Payer: Self-pay | Admitting: Family Medicine

## 2023-09-21 ENCOUNTER — Ambulatory Visit: Payer: PRIVATE HEALTH INSURANCE | Admitting: Physician Assistant

## 2023-09-21 DIAGNOSIS — I10 Essential (primary) hypertension: Secondary | ICD-10-CM

## 2023-09-21 DIAGNOSIS — R748 Abnormal levels of other serum enzymes: Secondary | ICD-10-CM

## 2023-09-21 DIAGNOSIS — E781 Pure hyperglyceridemia: Secondary | ICD-10-CM

## 2023-09-21 DIAGNOSIS — E049 Nontoxic goiter, unspecified: Secondary | ICD-10-CM

## 2023-09-21 DIAGNOSIS — E119 Type 2 diabetes mellitus without complications: Secondary | ICD-10-CM

## 2023-10-11 ENCOUNTER — Telehealth: Payer: Self-pay | Admitting: Physician Assistant

## 2023-10-11 DIAGNOSIS — U071 COVID-19: Secondary | ICD-10-CM

## 2023-10-11 NOTE — Progress Notes (Signed)
 E-Visit for [employer] Virus / COVID Screening   You have tested positive for COVID-19, meaning that you were infected with the novel coronavirus and could give the virus to others.  Most people with COVID-19 have mild illness and can recover at home without medical care. Do not leave your home, except to get medical care. Do not visit public areas and do not go to places where you are unable to wear a mask. It is important that you stay home  to take care for yourself and to help protect other people in your home and community.      Since symptoms have resolved, I will provide a return to work note, but know that because we did not evaluate you during acute illness, I cannot give a work excuse for days missed themselves.  I have sent a work note to Pharmacologist. You can find by going to the Menu on your homepage, scrolling down to the Communications section, and selecting Letters. Let us  know if you have any issue locating. Take care and feel better soon!   Isolation Instructions:   You are to isolate at home until you have been fever free for at least 24 hours without a fever-reducing medication, and symptoms have been steadily improving for 24 hours. At that time,  you can end isolation but need to mask for an additional 5 days.  If you must be around other household members who do not have symptoms, you need to make sure that both you and the family members are masking consistently with a high-quality mask.  If you note any worsening of symptoms despite treatment, please seek an in-person evaluation ASAP. If you note any significant shortness of breath or any chest pain, please seek ER evaluation. Please do not delay care!  Go to the nearest hospital ED for assessment if fever/cough/breathlessness are severe or illness seems like a threat to life.    The following symptoms may appear 2-14 days after exposure: Fever Cough Shortness of breath or difficulty breathing Chills Repeated shaking  with chills Muscle pain Headache Sore throat New loss of taste or smell Fatigue Congestion or runny nose Nausea or vomiting Diarrhea  You may also take acetaminophen  (Tylenol ) as needed for fever.  HOME CARE Only take medications as instructed by your medical team. Drink plenty of fluids and get plenty of rest. A steam or ultrasonic humidifier can help if you have congestion.  GET HELP RIGHT AWAY IF YOU HAVE EMERGENCY WARNING SIGNS.  Call 911 or proceed to your closest emergency facility if: You develop worsening high fever. Trouble breathing Bluish lips or face Persistent pain or pressure in the chest New confusion Inability to wake or stay awake You cough up blood. Your symptoms become more severe Inability to hold down food or fluids  This list is not all possible symptoms. Contact your medical provider for any symptoms that are severe or concerning to you.   Your e-visit answers were reviewed by a board certified advanced clinical practitioner to complete your personal care plan.  Depending on the condition, your plan could have included both over the counter or prescription medications.  If there is a problem, please reply once you have received a response from your provider.  Your safety is important to us .  If you have drug allergies check your prescription carefully.    You can use MyChart to ask questions about today's visit, request a non-urgent call back, or ask for a work or school excuse  for 24 hours related to this e-Visit. If it has been greater than 24 hours you will need to follow up with your provider or enter a new e-Visit to address those concerns. You will get an e-mail in the next two days asking about your experience.  I hope that your e-visit has been valuable and will speed your recovery. Thank you for using e-visits.

## 2023-10-11 NOTE — Progress Notes (Signed)
 I have spent 5 minutes in review of e-visit questionnaire, review and updating patient chart, medical decision making and response to patient.   Piedad Climes, PA-C

## 2023-10-15 ENCOUNTER — Other Ambulatory Visit: Payer: Self-pay | Admitting: Family Medicine

## 2023-10-15 DIAGNOSIS — E119 Type 2 diabetes mellitus without complications: Secondary | ICD-10-CM

## 2023-11-16 ENCOUNTER — Other Ambulatory Visit: Payer: Self-pay | Admitting: Family Medicine

## 2023-11-16 ENCOUNTER — Telehealth: Payer: Self-pay | Admitting: Family Medicine

## 2023-11-16 DIAGNOSIS — E119 Type 2 diabetes mellitus without complications: Secondary | ICD-10-CM

## 2023-11-16 NOTE — Telephone Encounter (Signed)
Medication Refill -  Most Recent Primary Care Visit:  Provider: [name]  Department: [id number]  Visit Type: OFFICE VISIT  Date: 03/14/2023  Medication: Semaglutide, 2 MG/DOSE, (OZEMPIC, 2 MG/DOSE,) 8 MG/3ML SOPN  Pt scheduled a visit with Janna on 11/28/23. Pt requesting courtesy refill in the meantime as she is out of the medication.   Has the patient contacted their pharmacy? Yes   Is this the correct pharmacy for this prescription? Yes This is the patient's preferred pharmacy:  [facility] [place], [place] - [address] Chew [place] [address] Phone: [phone] Fax: [phone]   Has the prescription been filled recently? No  Is the patient out of the medication? Yes  Has the patient been seen for an appointment in the last year OR does the patient have an upcoming appointment? Yes  Can we respond through MyChart? Yes  Agent: Please be advised that Rx refills may take up to 3 business days. We ask that you follow-up with your pharmacy.

## 2023-11-16 NOTE — Telephone Encounter (Signed)
Refilled today in a separate refill encounter.

## 2023-11-16 NOTE — Telephone Encounter (Signed)
Requested Prescriptions  Pending Prescriptions Disp Refills   Semaglutide, 2 MG/DOSE, (OZEMPIC, 2 MG/DOSE,) 8 MG/3ML SOPN [Pharmacy Med Name: OZEMPIC (2 MG/DOSE) 8 MG/3ML SUBQ S] 3 mL 0    Sig: INJECT 2MG  AS DIRECTED ONCE A WEEK     Endocrinology:  Diabetes - GLP-1 Receptor Agonists - semaglutide Failed - 11/16/2023 10:27 AM      Failed - HBA1C in normal range and within 180 days    Hemoglobin A1C  Date Value Ref Range Status  03/14/2023 5.4 4.0 - 5.6 % Final  11/20/2012 7.6 (H) 4.2 - 6.3 % Final    Comment:    The American Diabetes Association recommends that a primary goal of therapy should be <7% and that physicians should reevaluate the treatment regimen in patients with HbA1c values consistently >8%.    Hgb A1c MFr Bld  Date Value Ref Range Status  10/12/2022 5.7 (H) 4.8 - 5.6 % Final    Comment:             Prediabetes: 5.7 - 6.4          Diabetes: >6.4          Glycemic control for adults with diabetes: <7.0          Failed - Valid encounter within last 6 months    Recent Outpatient Visits           8 months ago Diabetes mellitus with no complication [facility])   [place] [facility] [place], [place], PA-C   1 year ago Diabetes mellitus with no complication [facility])   [place] [facility], [place], PA-C   1 year ago Annual physical exam   [facility] [facility] [place], [place], PA-C   1 year ago Diabetes mellitus with no complication [facility])   [place] [facility] [place], [place] [employer], PA-C   1 year ago Essential (primary) hypertension   [place] [facility] [place], [place], PA-C       Future Appointments             In 1 week [place], [place], PA-C [place] [facility], [facility]            Passed - Cr in normal range and within 360 days    Creatinine  Date Value Ref Range Status  11/21/2012 0.48 (L) 0.60 - 1.30 mg/dL Final   Creatinine, Ser  Date Value Ref Range  Status  03/15/2023 0.62 0.57 - 1.00 mg/dL Final

## 2023-11-25 NOTE — Progress Notes (Deleted)
 Established patient visit  Patient: Gabrielle Owens   DOB: 1970-12-18   53 y.o. Female  MRN: [id number] Visit Date: 11/28/2023  Today's healthcare provider: [name], PA-C   No chief complaint on file.  Subjective       Discussed the use of AI scribe software for clinical note transcription with the patient, who gave verbal consent to proceed.  History of Present Illness               10/12/2022    8:20 AM 07/20/2022    2:15 PM 03/21/2022    8:21 AM  Depression screen PHQ 2/9  Decreased Interest 0 0 0  Down, Depressed, Hopeless 0 0 1  PHQ - 2 Score 0 0 1  Altered sleeping 0 0 3  Tired, decreased energy 0 0 1  Change in appetite 0 0 0  Feeling bad or failure about yourself  0 0 0  Trouble concentrating 0 0 0  Moving slowly or fidgety/restless 0 0 0  Suicidal thoughts 0 0 0  PHQ-9 Score 0 0 5  Difficult doing work/chores Not difficult at all Not difficult at all Somewhat difficult      08/25/2020   11:28 AM  GAD 7 : Generalized Anxiety Score  Nervous, Anxious, on Edge 3  Control/stop worrying 3  Worry too much - different things 3  Trouble relaxing 3  Restless 3  Easily annoyed or irritable 3  Afraid - awful might happen 3  Total GAD 7 Score 21  Anxiety Difficulty Very difficult    Medications: Outpatient Medications Prior to Visit  Medication Sig   ALPRAZolam (XANAX) 0.5 MG tablet TAKE 1 TABLET BY MOUTH EVERY 8 HOURS AS NEEDED   aspirin 81 MG tablet Take 81 mg by mouth daily.   hydrochlorothiazide (HYDRODIURIL) 25 MG tablet TAKE 1 TABLET BY MOUTH DAILY   Metoprolol Succinate 100 MG CS24 Take by mouth in the morning and at bedtime.   nitroGLYCERIN (NITROSTAT) 0.4 MG SL tablet Place under the tongue.   PROAIR HFA 108 (90 Base) MCG/ACT inhaler    Semaglutide, 2 MG/DOSE, (OZEMPIC, 2 MG/DOSE,) 8 MG/3ML SOPN INJECT 2MG  AS DIRECTED ONCE A WEEK   simvastatin (ZOCOR) 20 MG tablet TAKE ONE TABLET EACH NIGHT   valsartan (DIOVAN) 160 MG tablet TAKE 1 TABLET BY  MOUTH DAILY ***THIS REPLACES LOSARTAN*** (Patient taking differently: 80 mg.)   No facility-administered medications prior to visit.    Review of Systems All negative Except see HPI   {Insert previous labs (optional):23779} {See past labs  Heme  Chem  Endocrine  Serology  Results Review (optional):1}   Objective    There were no vitals taken for this visit. {Insert last BP/Wt (optional):23777}{See vitals history (optional):1}   Physical Exam   No results found for any visits on 11/28/23.      Assessment and Plan             No orders of the defined types were placed in this encounter.   No follow-ups on file.   The patient was advised to call back or seek an in-person evaluation if the symptoms worsen or if the condition fails to improve as anticipated.  I discussed the assessment and treatment plan with the patient. The patient was provided an opportunity to ask questions and all were answered. The patient agreed with the plan and demonstrated an understanding of the instructions.  I, [name], PA-C have reviewed all documentation for this visit. The documentation on 11/28/2023  for the exam, diagnosis, procedures, and orders are all accurate and complete.  [name], [facility], MMS [facility] [phone] (phone) [phone] (fax)  [facility]

## 2023-11-28 ENCOUNTER — Ambulatory Visit: Payer: Self-pay | Admitting: Physician Assistant

## 2023-11-28 DIAGNOSIS — E119 Type 2 diabetes mellitus without complications: Secondary | ICD-10-CM

## 2023-12-12 NOTE — Progress Notes (Unsigned)
 Established patient visit  Patient: Gabrielle Owens   DOB: [birth year]   53 y.o. Female  MRN: [id number] Visit Date: 12/13/2023  Today's healthcare provider: [name], PA-C   No chief complaint on file.  Subjective       Discussed the use of AI scribe software for clinical note transcription with the patient, who gave verbal consent to proceed.  History of Present Illness               10/12/2022    8:20 AM 07/20/2022    2:15 PM 03/21/2022    8:21 AM  Depression screen PHQ 2/9  Decreased Interest 0 0 0  Down, Depressed, Hopeless 0 0 1  PHQ - 2 Score 0 0 1  Altered sleeping 0 0 3  Tired, decreased energy 0 0 1  Change in appetite 0 0 0  Feeling bad or failure about yourself  0 0 0  Trouble concentrating 0 0 0  Moving slowly or fidgety/restless 0 0 0  Suicidal thoughts 0 0 0  PHQ-9 Score 0 0 5  Difficult doing work/chores Not difficult at all Not difficult at all Somewhat difficult      08/25/2020   11:28 AM  GAD 7 : Generalized Anxiety Score  Nervous, Anxious, on Edge 3  Control/stop worrying 3  Worry too much - different things 3  Trouble relaxing 3  Restless 3  Easily annoyed or irritable 3  Afraid - awful might happen 3  Total GAD 7 Score 21  Anxiety Difficulty Very difficult    Medications: Outpatient Medications Prior to Visit  Medication Sig  . ALPRAZolam (XANAX) 0.5 MG tablet TAKE 1 TABLET BY MOUTH EVERY 8 HOURS AS NEEDED  . aspirin 81 MG tablet Take 81 mg by mouth daily.  . hydrochlorothiazide (HYDRODIURIL) 25 MG tablet TAKE 1 TABLET BY MOUTH DAILY  . Metoprolol Succinate 100 MG CS24 Take by mouth in the morning and at bedtime.  . nitroGLYCERIN (NITROSTAT) 0.4 MG SL tablet Place under the tongue.  Marland Kitchen PROAIR HFA 108 (90 Base) MCG/ACT inhaler   . Semaglutide, 2 MG/DOSE, (OZEMPIC, 2 MG/DOSE,) 8 MG/3ML SOPN INJECT 2MG  AS DIRECTED ONCE A WEEK  . simvastatin (ZOCOR) 20 MG tablet TAKE ONE TABLET EACH NIGHT  . valsartan (DIOVAN) 160 MG tablet TAKE 1  TABLET BY MOUTH DAILY ***THIS REPLACES LOSARTAN*** (Patient taking differently: 80 mg.)   No facility-administered medications prior to visit.    Review of Systems  All other systems reviewed and are negative. All negative Except see HPI   {Insert previous labs (optional):23779} {See past labs  Heme  Chem  Endocrine  Serology  Results Review (optional):1}   Objective    There were no vitals taken for this visit. {Insert last BP/Wt (optional):23777}{See vitals history (optional):1}   Physical Exam Vitals reviewed.  Constitutional:      General: She is not in acute distress.    Appearance: Normal appearance. She is well-developed. She is not diaphoretic.  HENT:     Head: Normocephalic and atraumatic.  Eyes:     General: No scleral icterus.    Conjunctiva/sclera: Conjunctivae normal.  Neck:     Thyroid: No thyromegaly.  Cardiovascular:     Rate and Rhythm: Normal rate and regular rhythm.     Pulses: Normal pulses.     Heart sounds: Normal heart sounds. No murmur heard. Pulmonary:     Effort: Pulmonary effort is normal. No respiratory distress.     Breath sounds: Normal breath sounds.  No wheezing, rhonchi or rales.  Musculoskeletal:     Cervical back: Neck supple.     Right lower leg: No edema.     Left lower leg: No edema.  Lymphadenopathy:     Cervical: No cervical adenopathy.  Skin:    General: Skin is warm and dry.     Findings: No rash.  Neurological:     Mental Status: She is alert and oriented to person, place, and time. Mental status is at baseline.  Psychiatric:        Mood and Affect: Mood normal.        Behavior: Behavior normal.     No results found for any visits on 12/13/23.      Assessment and Plan             No orders of the defined types were placed in this encounter.   No follow-ups on file.   The patient was advised to call back or seek an in-person evaluation if the symptoms worsen or if the condition fails to improve as  anticipated.  I discussed the assessment and treatment plan with the patient. The patient was provided an opportunity to ask questions and all were answered. The patient agreed with the plan and demonstrated an understanding of the instructions.  I, [name], PA-C have reviewed all documentation for this visit. The documentation on 12/13/2023  for the exam, diagnosis, procedures, and orders are all accurate and complete.  [name], [facility], MMS [facility] [phone] (phone) [phone] (fax)  [facility]

## 2023-12-13 ENCOUNTER — Ambulatory Visit: Payer: PRIVATE HEALTH INSURANCE | Admitting: Physician Assistant

## 2023-12-13 ENCOUNTER — Encounter: Payer: Self-pay | Admitting: Physician Assistant

## 2023-12-13 ENCOUNTER — Encounter: Payer: Self-pay | Admitting: Family Medicine

## 2023-12-13 VITALS — BP 131/80 | HR 78 | Resp 18 | Ht 62.0 in | Wt 134.0 lb

## 2023-12-13 DIAGNOSIS — E119 Type 2 diabetes mellitus without complications: Secondary | ICD-10-CM

## 2023-12-13 DIAGNOSIS — I10 Essential (primary) hypertension: Secondary | ICD-10-CM | POA: Diagnosis not present

## 2023-12-13 DIAGNOSIS — E049 Nontoxic goiter, unspecified: Secondary | ICD-10-CM | POA: Diagnosis not present

## 2023-12-13 DIAGNOSIS — E1169 Type 2 diabetes mellitus with other specified complication: Secondary | ICD-10-CM | POA: Diagnosis not present

## 2023-12-13 DIAGNOSIS — E781 Pure hyperglyceridemia: Secondary | ICD-10-CM | POA: Diagnosis not present

## 2023-12-13 DIAGNOSIS — K5909 Other constipation: Secondary | ICD-10-CM

## 2023-12-13 MED ORDER — SEMAGLUTIDE (1 MG/DOSE) 4 MG/3ML ~~LOC~~ SOPN: 1.0000 mg | PEN_INJECTOR | SUBCUTANEOUS | 1 refills | Status: DC

## 2024-02-07 ENCOUNTER — Other Ambulatory Visit: Payer: Self-pay | Admitting: Family Medicine

## 2024-02-26 ENCOUNTER — Other Ambulatory Visit: Payer: Self-pay | Admitting: Physician Assistant

## 2024-02-26 DIAGNOSIS — E119 Type 2 diabetes mellitus without complications: Secondary | ICD-10-CM

## 2024-02-27 NOTE — Telephone Encounter (Signed)
 Have still not done labs ordered at march office visit. Need labs done before refill can be approved.

## 2024-03-07 ENCOUNTER — Telehealth: Payer: Self-pay

## 2024-03-07 NOTE — Telephone Encounter (Signed)
 [facility] called requesting for Ozempic  medication to be sent to two different pharmacy, per the understanding of the call center the reasoning is if one pharmacy covers a certain amount the other pharmacy could cover the remaining dosage. She tried to connect me with them but it seem unsuccessful. I will try to return call later today at [phone], that's [facility] number.   11:22am, Spoke with Edwina Gram. States a requested a fax for Semaglutide  medication. States it is not covered by her insurance for her to do refills at a local pharmacy, that is why she has mail order which is [facility], as they can do the refills for her. I advised Edwina Gram refill was sent to [facility] pharmacy and was confirmed on 02/28/24. They would reach out to pt to inform.

## 2024-03-10 ENCOUNTER — Encounter: Payer: Self-pay | Admitting: Family Medicine

## 2024-03-18 ENCOUNTER — Other Ambulatory Visit: Payer: Self-pay | Admitting: Family Medicine

## 2024-03-18 DIAGNOSIS — F439 Reaction to severe stress, unspecified: Secondary | ICD-10-CM

## 2024-03-20 ENCOUNTER — Telehealth: Payer: Self-pay

## 2024-03-20 DIAGNOSIS — F439 Reaction to severe stress, unspecified: Secondary | ICD-10-CM

## 2024-03-20 DIAGNOSIS — E119 Type 2 diabetes mellitus without complications: Secondary | ICD-10-CM

## 2024-03-20 NOTE — Telephone Encounter (Unsigned)
 Copied from CRM [id number]. Topic: Clinical - Prescription Issue >> Mar 20, 2024  2:28 PM Hassie Lint wrote: Reason for CRM: Patient states she had her labs done in March and [facility] must have lost her results. States she is not able to come in until next week to have her lab work done but needs her Ozempic  refilled prior. Also inquiring why her request for a refill of ALPRAZolam  (XANAX ) 0.5 MG tablet was denied. Patient would like a call back to discuss  Patient can be reached at [phone]

## 2024-03-21 MED ORDER — ALPRAZOLAM 0.5 MG PO TABS: 0.5000 mg | ORAL_TABLET | Freq: Two times a day (BID) | ORAL | 0 refills | Status: AC | PRN

## 2024-03-21 NOTE — Telephone Encounter (Signed)
 Please verify with Denman Fischer if orders from March 13th are still open. If so then patient can stop by lab next week to have blood drawn and we don't need to reorder them.   Have sent in 1 week supply of alprazolam 

## 2024-03-21 NOTE — Telephone Encounter (Signed)
 Called [facility] and checked and they don't have any labs for patient.  Patient is requesting a refill on the Ozempic  and Xanax 

## 2024-03-27 ENCOUNTER — Ambulatory Visit: Payer: Self-pay | Admitting: Physician Assistant

## 2024-03-27 LAB — CBC WITH DIFFERENTIAL/PLATELET
Basophils Absolute: 0 10*3/uL (ref 0.0–0.2)
Basos: 1 %
EOS (ABSOLUTE): 0.2 10*3/uL (ref 0.0–0.4)
Eos: 2 %
Hematocrit: 43.8 % (ref 34.0–46.6)
Hemoglobin: 14.3 g/dL (ref 11.1–15.9)
Immature Grans (Abs): 0 10*3/uL (ref 0.0–0.1)
Immature Granulocytes: 0 %
Lymphocytes Absolute: 2.4 10*3/uL (ref 0.7–3.1)
Lymphs: 34 %
MCH: 32.9 pg (ref 26.6–33.0)
MCHC: 32.6 g/dL (ref 31.5–35.7)
MCV: 101 fL — ABNORMAL HIGH (ref 79–97)
Monocytes Absolute: 0.7 10*3/uL (ref 0.1–0.9)
Monocytes: 9 %
Neutrophils Absolute: 3.8 10*3/uL (ref 1.4–7.0)
Neutrophils: 54 %
Platelets: 269 10*3/uL (ref 150–450)
RBC: 4.35 x10E6/uL (ref 3.77–5.28)
RDW: 12.1 % (ref 11.7–15.4)
WBC: 7.1 10*3/uL (ref 3.4–10.8)

## 2024-03-27 LAB — COMPREHENSIVE METABOLIC PANEL WITH GFR
ALT: 12 IU/L (ref 0–32)
AST: 17 IU/L (ref 0–40)
Albumin: 4.4 g/dL (ref 3.8–4.9)
Alkaline Phosphatase: 77 IU/L (ref 44–121)
BUN/Creatinine Ratio: 26 — ABNORMAL HIGH (ref 9–23)
BUN: 15 mg/dL (ref 6–24)
Bilirubin Total: 0.3 mg/dL (ref 0.0–1.2)
CO2: 22 mmol/L (ref 20–29)
Calcium: 9.7 mg/dL (ref 8.7–10.2)
Chloride: 102 mmol/L (ref 96–106)
Creatinine, Ser: 0.58 mg/dL (ref 0.57–1.00)
Globulin, Total: 2.6 g/dL (ref 1.5–4.5)
Glucose: 85 mg/dL (ref 70–99)
Potassium: 5 mmol/L (ref 3.5–5.2)
Sodium: 139 mmol/L (ref 134–144)
Total Protein: 7 g/dL (ref 6.0–8.5)
eGFR: 108 mL/min/{1.73_m2} (ref >59)

## 2024-03-27 LAB — HEMOGLOBIN A1C
Est. average glucose Bld gHb Est-mCnc: 94 mg/dL
Hgb A1c MFr Bld: 4.9 % (ref 4.8–5.6)

## 2024-03-27 LAB — LIPID PANEL
Chol/HDL Ratio: 3.5 ratio (ref 0.0–4.4)
Cholesterol, Total: 157 mg/dL (ref 100–199)
HDL: 45 mg/dL (ref >39)
LDL Chol Calc (NIH): 88 mg/dL (ref 0–99)
Triglycerides: 134 mg/dL (ref 0–149)
VLDL Cholesterol Cal: 24 mg/dL (ref 5–40)

## 2024-03-27 LAB — TSH: TSH: 1.1 u[IU]/mL (ref 0.450–4.500)

## 2024-03-27 LAB — T4, FREE: Free T4: 1.13 ng/dL (ref 0.82–1.77)

## 2024-03-31 ENCOUNTER — Telehealth: Payer: Self-pay

## 2024-03-31 NOTE — Telephone Encounter (Signed)
 Pharmacy Patient Advocate Encounter   Received notification from Onbase that prior authorization for Ozempic  (1 MG/DOSE) 4MG /3ML pen-injectors  is required/requested.   Insurance verification completed.   The patient is insured through [facility] MEDICAID .   Per test claim: PA required; PA submitted to above mentioned insurance via CoverMyMeds Key/confirmation #/EOC [id number] Status is pending

## 2024-04-01 ENCOUNTER — Other Ambulatory Visit: Payer: Self-pay

## 2024-04-01 ENCOUNTER — Telehealth: Payer: Self-pay

## 2024-04-01 DIAGNOSIS — E119 Type 2 diabetes mellitus without complications: Secondary | ICD-10-CM

## 2024-04-01 NOTE — Addendum Note (Signed)
 Addended by: GASPER NANCYANN BRAVO on: 04/01/2024 03:55 PM   Modules accepted: Orders

## 2024-04-01 NOTE — Telephone Encounter (Signed)
 I looked up this pharmacy and there are 2 listing in [facility]  but neither with the same fax/phone number Is it ok to print this off and leave on your desk for you to sign tomorrow and then have it faxed to them? Thanks     Copied from CRM [id number]. Topic: Clinical - Prescription Issue >> Apr 01, 2024 12:53 PM Corin V wrote: Reason for CRM: Alexandrai from [employer] called and stated that they had sent a resquest for the Ozempic  to the office and need the script faxed to them at [phone] or sent  electronically to [facility] in [place], [place]  [address].

## 2024-04-08 ENCOUNTER — Other Ambulatory Visit: Payer: Self-pay | Admitting: Family Medicine

## 2024-04-08 DIAGNOSIS — F439 Reaction to severe stress, unspecified: Secondary | ICD-10-CM

## 2024-04-08 NOTE — Telephone Encounter (Signed)
 Patient has not taken this med in months

## 2024-04-08 NOTE — Telephone Encounter (Signed)
 Requesting: ALPRAZolam  (XANAX ) 0.5 MG tablet  Contract: No UDS: none Last Visit: 12/13/2023 with Janna Oswalt Next Visit: not scheduled  Last Refill: 07/06/2023 #30  Request from 6/19 was refused  Please Advise

## 2024-04-09 MED ORDER — ALPRAZOLAM 0.5 MG PO TABS: 0.5000 mg | ORAL_TABLET | Freq: Three times a day (TID) | ORAL | 3 refills | Status: AC | PRN

## 2024-04-10 ENCOUNTER — Other Ambulatory Visit: Payer: Self-pay | Admitting: Family Medicine

## 2024-04-22 ENCOUNTER — Other Ambulatory Visit (HOSPITAL_COMMUNITY): Payer: Self-pay

## 2024-06-24 ENCOUNTER — Encounter: Payer: Self-pay | Admitting: Physician Assistant

## 2024-06-24 DIAGNOSIS — Z1211 Encounter for screening for malignant neoplasm of colon: Secondary | ICD-10-CM

## 2024-06-26 ENCOUNTER — Telehealth: Payer: Self-pay

## 2024-06-26 ENCOUNTER — Other Ambulatory Visit: Payer: Self-pay

## 2024-06-26 DIAGNOSIS — Z1211 Encounter for screening for malignant neoplasm of colon: Secondary | ICD-10-CM

## 2024-06-26 MED ORDER — NA SULFATE-K SULFATE-MG SULF 17.5-3.13-1.6 GM/177ML PO SOLN: 1.0000 | Freq: Once | ORAL | 0 refills | Status: AC

## 2024-06-26 NOTE — Telephone Encounter (Signed)
 Gastroenterology Pre-Procedure Review  Request Date: 09/19/24 Requesting Physician: Dr. Melany  PATIENT REVIEW QUESTIONS: The patient responded to the following health history questions as indicated:    1. Are you having any GI issues? no 2. Do you have a personal history of Polyps? no 3. Do you have a family history of Colon Cancer or Polyps? no 4. Diabetes Mellitus? yes (has been advised to stop ozempic  7 days prior) 5. Joint replacements in the past 12 months?no 6. Major health problems in the past 3 months?no 7. Any artificial heart valves, MVP, or defibrillator?no    MEDICATIONS & ALLERGIES:    Patient reports the following regarding taking any anticoagulation/antiplatelet therapy:   Plavix, Coumadin, Eliquis, Xarelto, Lovenox, Pradaxa, Brilinta, or Effient? no Aspirin? yes (81 mg daily)  Patient confirms/reports the following medications:     Patient confirms/reports the following allergies:  No Known Allergies  No orders of the defined types were placed in this encounter.   AUTHORIZATION INFORMATION Primary Insurance: 1D#: Group #:  Secondary Insurance: 1D#: Group #:  SCHEDULE INFORMATION: Date: 09/19/24 Time: Location: [facility]

## 2024-06-26 NOTE — Telephone Encounter (Deleted)
 Gastroenterology Pre-Procedure Review  Request Date: 09/19/24 Requesting Physician: Dr. Marguerite  PATIENT REVIEW QUESTIONS: The patient responded to the following health history questions as indicated:    1. Are you having any GI issues? no 2. Do you have a personal history of Polyps? no 3. Do you have a family history of Colon Cancer or Polyps? no 4. Diabetes Mellitus? Yes has been advised to stop Ozempic  7 days prior to colonoscopy 5. Joint replacements in the past 12 months?no 6. Major health problems in the past 3 months?no 7. Any artificial heart valves, MVP, or defibrillator?no    MEDICATIONS & ALLERGIES:    Patient reports the following regarding taking any anticoagulation/antiplatelet therapy:   Plavix, Coumadin, Eliquis, Xarelto, Lovenox, Pradaxa, Brilinta, or Effient? no Aspirin? yes (81 mg daily)  Patient confirms/reports the following medications:  Current Outpatient Medications  Medication Sig Dispense Refill   Na Sulfate-K Sulfate-Mg Sulfate concentrate (SUPREP) 17.5-3.13-1.6 GM/177ML SOLN Take 1 kit (354 mLs total) by mouth once for 1 dose. 354 mL 0   ALPRAZolam  (XANAX ) 0.5 MG tablet Take 1 tablet (0.5 mg total) by mouth every 8 (eight) hours as needed. 30 tablet 3   aspirin 81 MG tablet Take 81 mg by mouth daily.     hydrochlorothiazide (HYDRODIURIL) 25 MG tablet TAKE 1 TABLET BY MOUTH DAILY 90 tablet 2   Metoprolol Succinate 100 MG CS24 Take by mouth in the morning and at bedtime.     PROAIR  HFA 108 (90 Base) MCG/ACT inhaler      Semaglutide , 1 MG/DOSE, (OZEMPIC , 1 MG/DOSE,) 4 MG/3ML SOPN INJECT 1 MG INTO THE SKIN ONCE A WEEK 3 mL 2   simvastatin (ZOCOR) 20 MG tablet TAKE ONE TABLET EACH NIGHT 90 tablet 1   valsartan  (DIOVAN ) 160 MG tablet TAKE 1 TABLET BY MOUTH DAILY ***THIS REPLACES LOSARTAN*** (Patient taking differently: 80 mg.) 90 tablet 4   No current facility-administered medications for this visit.    Patient confirms/reports the following allergies:  No  Known Allergies  No orders of the defined types were placed in this encounter.   AUTHORIZATION INFORMATION Primary Insurance: 1D#: Group #:  Secondary Insurance: 1D#: Group #:  SCHEDULE INFORMATION: Date: 09/19/24 Time: Location: [facility]

## 2024-08-20 ENCOUNTER — Telehealth: Payer: Self-pay

## 2024-08-20 NOTE — Telephone Encounter (Signed)
 Per [phone] pt doesn't need prior auth but per [employer] PT must call to complete process so that [employer]  will cover more percentage of the procedure. I call left pt message with [employer] to call to finish process.  Ref call [id number]

## 2024-09-15 ENCOUNTER — Encounter: Payer: Self-pay | Admitting: Gastroenterology

## 2024-09-17 ENCOUNTER — Encounter: Payer: Self-pay | Admitting: Gastroenterology

## 2024-09-17 NOTE — Anesthesia Preprocedure Evaluation (Signed)
 Anesthesia Evaluation    Airway        Dental   Pulmonary former smoker          Cardiovascular hypertension,      Neuro/Psych    GI/Hepatic   Endo/Other  diabetes    Renal/GU      Musculoskeletal   Abdominal   Peds  Hematology   Anesthesia Other Findings Diabetes mellitus without complication Hyperlipidemia Anxiety  Hypertension Coronary artery disease  ADD (attention deficit disorder) without hyperactivity Cervical high risk HPV (human papillomavirus) test positive  COVID-19    Reproductive/Obstetrics                              Anesthesia Physical Anesthesia Plan Anesthesia Quick Evaluation

## 2024-10-07 ENCOUNTER — Telehealth: Payer: PRIVATE HEALTH INSURANCE | Admitting: Nurse Practitioner

## 2024-10-07 DIAGNOSIS — J069 Acute upper respiratory infection, unspecified: Secondary | ICD-10-CM

## 2024-10-07 DIAGNOSIS — R6889 Other general symptoms and signs: Secondary | ICD-10-CM

## 2024-10-07 MED ORDER — OSELTAMIVIR PHOSPHATE 75 MG PO CAPS: 75.0000 mg | ORAL_CAPSULE | Freq: Two times a day (BID) | ORAL | 0 refills | Status: AC

## 2024-10-07 MED ORDER — ONDANSETRON 4 MG PO TBDP: 4.0000 mg | ORAL_TABLET | Freq: Three times a day (TID) | ORAL | 0 refills | Status: AC | PRN

## 2024-10-07 NOTE — Progress Notes (Signed)
 E visit for Flu like symptoms   We are sorry that you are not feeling well.  Here is how we plan to help! Based on what you have shared with me it looks like you may have a respiratory virus that may be influenza.  Influenza or the flu is  an infection caused by a respiratory virus. The flu virus is highly contagious and persons who did not receive their yearly flu vaccination may catch the flu from close contact.  We have anti-viral medications to treat the viruses that cause this infection. They are not a cure and only shorten the course of the infection. These prescriptions are most effective when they are given within the first 2 days of flu symptoms. Antiviral medications are indicated if you have a high risk of complications from the flu. You should  also consider an antiviral medication if you are in close contact with someone who is at risk. These medications can help patients avoid complications from the flu but have side effects that you should know.   Possible side effects from Tamiflu  or oseltamivir  include nausea, vomiting, diarrhea, dizziness, headaches, eye redness, sleep problems or other respiratory symptoms. You should not take Tamiflu  if you have an allergy to oseltamivir  or any to the ingredients in Tamiflu .  Based upon your symptoms and potential risk factors I have prescribed Oseltamivir  (Tamiflu ).  It has been sent to your designated pharmacy.  You will take one 75 mg capsule orally twice a day for the next 5 days.   For nasal congestion, you may use an oral decongestant such as Mucinex D or if you have glaucoma or high blood pressure use plain Mucinex.  Saline nasal spray or nasal drops can help and can safely be used as often as needed for congestion.  If you have a sore or scratchy throat, use a saltwater gargle-  to  teaspoon of salt dissolved in a 4-ounce to 8-ounce glass of warm water.  Gargle the solution for approximately 15-30 seconds and then spit.  It is  important not to swallow the solution.  You can also use throat lozenges/cough drops and Chloraseptic spray to help with throat pain or discomfort.  Warm or cold liquids can also be helpful in relieving throat pain.  For headache, pain or general discomfort, you can use Ibuprofen or Tylenol  as directed.   Some authorities believe that zinc sprays or the use of Echinacea may shorten the course of your symptoms.  I have prescribed the following medications to help lessen symptoms: I have prescribed Zofran  4 mg tablets 1 every 6 hours if needed for nausea  You are to isolate at home until you have been fever-free for at least 24 hours without a fever-reducing medication, and symptoms have been steadily improving for 24 hours.  If you must be around other household members who do not have symptoms, you need to make sure that both you and the family members are masking consistently with a high-quality mask.  If you note any worsening of symptoms despite treatment, please seek an in-person evaluation ASAP. If you note any significant shortness of breath or any chest pain, please seek ED evaluation. Please do not delay care!  ANYONE WHO HAS FLU SYMPTOMS SHOULD: Stay home. The flu is highly contagious and going out or to work exposes others! Be sure to drink plenty of fluids. Water is fine as well as fruit juices, sodas and electrolyte beverages. You may want to stay away from caffeine or  alcohol. If you are nauseated, try taking small sips of liquids. How do you know if you are getting enough fluid? Your urine should be a pale yellow or almost colorless. Get rest. Taking a steamy shower or using a humidifier may help nasal congestion and ease sore throat pain. Using a saline nasal spray works much the same way. Cough drops, hard candies and sore throat lozenges may ease your cough. Line up a caregiver. Have someone check on you regularly.  GET HELP RIGHT AWAY IF: You cannot keep down liquids or your  medications. You become short of breath Your fell like you are going to pass out or loose consciousness. Your symptoms persist after you have completed your treatment plan  MAKE SURE YOU  Understand these instructions. Will watch your condition. Will get help right away if you are not doing well or get worse.  Your e-visit answers were reviewed by a board certified advanced clinical practitioner to complete your personal care plan.  Depending on the condition, your plan could have included both over the counter or prescription medications.  If there is a problem please reply  once you have received a response from your provider.  Your safety is important to us .  If you have drug allergies check your prescription carefully.    You can use MyChart to ask questions about todays visit, request a non-urgent call back, or ask for a work or school excuse for 24 hours related to this e-Visit. If it has been greater than 24 hours you will need to follow up with your provider, or enter a new e-Visit to address those concerns.  You will get an e-mail in the next two days asking about your experience.  I hope that your e-visit has been valuable and will speed your recovery. Thank you for using e-visits.   I have spent 5 minutes in review of e-visit questionnaire, review and updating patient chart, medical decision making and response to patient.   Lauraine Kitty, FNP

## 2024-10-07 NOTE — Progress Notes (Signed)
 Message sent to patient requesting further input regarding current symptoms. Awaiting patient response.

## 2024-10-08 ENCOUNTER — Other Ambulatory Visit: Payer: Self-pay | Admitting: Medical Genetics

## 2024-10-17 ENCOUNTER — Ambulatory Visit
Admission: RE | Admit: 2024-10-17 | Payer: PRIVATE HEALTH INSURANCE | Source: Home / Self Care | Admitting: Gastroenterology

## 2024-10-17 ENCOUNTER — Encounter: Payer: Self-pay | Admitting: Anesthesiology

## 2024-10-17 ENCOUNTER — Encounter: Admission: RE | Payer: Self-pay | Source: Home / Self Care

## 2024-10-17 SURGERY — COLONOSCOPY
Anesthesia: Choice
